# Patient Record
Sex: Male | Born: 1970 | Race: White | Hispanic: No | Marital: Married | Smoking: Never smoker
Health system: Southern US, Community
[De-identification: ages and names within clinical notes are randomized; demographics above are authoritative.]

## PROBLEM LIST (undated history)

## (undated) DIAGNOSIS — F329 Major depressive disorder, single episode, unspecified: Secondary | ICD-10-CM

## (undated) DIAGNOSIS — S2239XA Fracture of one rib, unspecified side, initial encounter for closed fracture: Secondary | ICD-10-CM

## (undated) DIAGNOSIS — E785 Hyperlipidemia, unspecified: Secondary | ICD-10-CM

## (undated) DIAGNOSIS — F32A Depression, unspecified: Secondary | ICD-10-CM

## (undated) DIAGNOSIS — IMO0002 Reserved for concepts with insufficient information to code with codable children: Secondary | ICD-10-CM

## (undated) DIAGNOSIS — H6980 Other specified disorders of Eustachian tube, unspecified ear: Secondary | ICD-10-CM

## (undated) DIAGNOSIS — D179 Benign lipomatous neoplasm, unspecified: Secondary | ICD-10-CM

## (undated) DIAGNOSIS — H6990 Unspecified Eustachian tube disorder, unspecified ear: Secondary | ICD-10-CM

## (undated) DIAGNOSIS — K594 Anal spasm: Secondary | ICD-10-CM

## (undated) DIAGNOSIS — F988 Other specified behavioral and emotional disorders with onset usually occurring in childhood and adolescence: Secondary | ICD-10-CM

## (undated) DIAGNOSIS — R030 Elevated blood-pressure reading, without diagnosis of hypertension: Secondary | ICD-10-CM

## (undated) DIAGNOSIS — J342 Deviated nasal septum: Secondary | ICD-10-CM

## (undated) DIAGNOSIS — D1803 Hemangioma of intra-abdominal structures: Secondary | ICD-10-CM

## (undated) HISTORY — DX: Depression, unspecified: F32.A

## (undated) HISTORY — DX: Hemangioma of intra-abdominal structures: D18.03

## (undated) HISTORY — DX: Other specified behavioral and emotional disorders with onset usually occurring in childhood and adolescence: F98.8

## (undated) HISTORY — DX: Fracture of one rib, unspecified side, initial encounter for closed fracture: S22.39XA

## (undated) HISTORY — DX: Unspecified eustachian tube disorder, unspecified ear: H69.90

## (undated) HISTORY — DX: Benign lipomatous neoplasm, unspecified: D17.9

## (undated) HISTORY — DX: Hyperlipidemia, unspecified: E78.5

## (undated) HISTORY — DX: Major depressive disorder, single episode, unspecified: F32.9

## (undated) HISTORY — DX: Deviated nasal septum: J34.2

## (undated) HISTORY — DX: Reserved for concepts with insufficient information to code with codable children: IMO0002

## (undated) HISTORY — DX: Anal spasm: K59.4

## (undated) HISTORY — PX: COLONOSCOPY: SHX174

## (undated) HISTORY — DX: Other specified disorders of Eustachian tube, unspecified ear: H69.80

## (undated) HISTORY — DX: Elevated blood-pressure reading, without diagnosis of hypertension: R03.0

---

## 2001-06-05 ENCOUNTER — Encounter: Admission: RE | Admit: 2001-06-05 | Discharge: 2001-06-05 | Payer: Self-pay | Admitting: Infectious Diseases

## 2001-06-17 ENCOUNTER — Encounter: Admission: RE | Admit: 2001-06-17 | Discharge: 2001-06-17 | Payer: Self-pay | Admitting: Infectious Diseases

## 2001-06-20 ENCOUNTER — Encounter: Admission: RE | Admit: 2001-06-20 | Discharge: 2001-06-20 | Payer: Self-pay | Admitting: Infectious Diseases

## 2001-06-25 ENCOUNTER — Encounter: Admission: RE | Admit: 2001-06-25 | Discharge: 2001-06-25 | Payer: Self-pay | Admitting: Infectious Diseases

## 2001-06-25 ENCOUNTER — Encounter: Payer: Self-pay | Admitting: Infectious Diseases

## 2001-06-25 ENCOUNTER — Ambulatory Visit (HOSPITAL_COMMUNITY): Admission: RE | Admit: 2001-06-25 | Discharge: 2001-06-25 | Payer: Self-pay | Admitting: Infectious Diseases

## 2001-11-20 ENCOUNTER — Emergency Department (HOSPITAL_COMMUNITY): Admission: EM | Admit: 2001-11-20 | Discharge: 2001-11-20 | Payer: Self-pay | Admitting: Emergency Medicine

## 2001-11-20 ENCOUNTER — Encounter: Payer: Self-pay | Admitting: Emergency Medicine

## 2003-07-24 ENCOUNTER — Encounter: Admission: RE | Admit: 2003-07-24 | Discharge: 2003-07-24 | Payer: Self-pay | Admitting: Family Medicine

## 2003-08-01 ENCOUNTER — Encounter: Admission: RE | Admit: 2003-08-01 | Discharge: 2003-08-01 | Payer: Self-pay | Admitting: Family Medicine

## 2004-02-09 ENCOUNTER — Ambulatory Visit: Payer: Self-pay | Admitting: Family Medicine

## 2004-04-05 ENCOUNTER — Ambulatory Visit: Payer: Self-pay | Admitting: Internal Medicine

## 2004-04-27 ENCOUNTER — Ambulatory Visit: Payer: Self-pay | Admitting: Internal Medicine

## 2004-05-05 ENCOUNTER — Encounter: Payer: Self-pay | Admitting: Internal Medicine

## 2004-05-25 ENCOUNTER — Ambulatory Visit: Payer: Self-pay | Admitting: Internal Medicine

## 2004-05-25 LAB — HM COLONOSCOPY

## 2004-09-30 ENCOUNTER — Ambulatory Visit: Payer: Self-pay | Admitting: Internal Medicine

## 2004-10-26 ENCOUNTER — Ambulatory Visit: Payer: Self-pay | Admitting: Internal Medicine

## 2005-02-20 ENCOUNTER — Ambulatory Visit: Payer: Self-pay | Admitting: Internal Medicine

## 2005-05-31 ENCOUNTER — Ambulatory Visit: Payer: Self-pay | Admitting: Internal Medicine

## 2005-10-05 ENCOUNTER — Ambulatory Visit: Payer: Self-pay | Admitting: Internal Medicine

## 2005-10-12 ENCOUNTER — Ambulatory Visit: Payer: Self-pay | Admitting: Internal Medicine

## 2006-04-02 ENCOUNTER — Ambulatory Visit (HOSPITAL_BASED_OUTPATIENT_CLINIC_OR_DEPARTMENT_OTHER): Admission: RE | Admit: 2006-04-02 | Discharge: 2006-04-02 | Payer: Self-pay | Admitting: Otolaryngology

## 2006-04-08 ENCOUNTER — Ambulatory Visit: Payer: Self-pay | Admitting: Internal Medicine

## 2006-08-30 ENCOUNTER — Ambulatory Visit: Payer: Self-pay | Admitting: Internal Medicine

## 2006-08-30 DIAGNOSIS — F988 Other specified behavioral and emotional disorders with onset usually occurring in childhood and adolescence: Secondary | ICD-10-CM | POA: Insufficient documentation

## 2006-08-30 DIAGNOSIS — Z8719 Personal history of other diseases of the digestive system: Secondary | ICD-10-CM

## 2006-08-30 DIAGNOSIS — R030 Elevated blood-pressure reading, without diagnosis of hypertension: Secondary | ICD-10-CM

## 2006-08-30 DIAGNOSIS — F411 Generalized anxiety disorder: Secondary | ICD-10-CM | POA: Insufficient documentation

## 2006-09-04 LAB — CONVERTED CEMR LAB
ALT: 24 U/L
AST: 24 U/L
BUN: 13 mg/dL
Basophils Absolute: 0 K/uL
Basophils Relative: 0.2 %
CO2: 33 meq/L — ABNORMAL HIGH
Calcium: 10.4 mg/dL
Chloride: 100 meq/L
Creatinine, Ser: 0.9 mg/dL
Eosinophils Absolute: 0.1 K/uL
Eosinophils Relative: 1.6 %
GFR calc Af Amer: 121 mL/min
GFR calc non Af Amer: 100 mL/min
Glucose, Bld: 85 mg/dL
HCT: 41.3 %
Hemoglobin: 14.5 g/dL
Lymphocytes Relative: 26.1 %
MCHC: 35.2 g/dL
MCV: 81.2 fL
Monocytes Absolute: 0.6 K/uL
Monocytes Relative: 7.2 %
Neutro Abs: 5 K/uL
Neutrophils Relative %: 64.9 %
Platelets: 304 K/uL
Potassium: 4.3 meq/L
RBC: 5.09 M/uL
RDW: 12.1 %
Sodium: 139 meq/L
TSH: 2.04 u[IU]/mL
WBC: 7.7 10*3/microliter

## 2006-11-15 ENCOUNTER — Ambulatory Visit: Payer: Self-pay | Admitting: Internal Medicine

## 2006-11-16 ENCOUNTER — Ambulatory Visit: Payer: Self-pay | Admitting: Internal Medicine

## 2006-11-16 LAB — CONVERTED CEMR LAB
Bilirubin Urine: NEGATIVE
Nitrite: NEGATIVE
Specific Gravity, Urine: 1.01
pH: 6

## 2006-11-20 ENCOUNTER — Ambulatory Visit: Payer: Self-pay | Admitting: Internal Medicine

## 2007-08-09 ENCOUNTER — Ambulatory Visit: Payer: Self-pay | Admitting: Internal Medicine

## 2007-08-12 ENCOUNTER — Encounter (INDEPENDENT_AMBULATORY_CARE_PROVIDER_SITE_OTHER): Payer: Self-pay | Admitting: *Deleted

## 2007-09-11 ENCOUNTER — Ambulatory Visit: Payer: Self-pay | Admitting: Internal Medicine

## 2007-09-11 ENCOUNTER — Encounter: Payer: Self-pay | Admitting: Internal Medicine

## 2007-09-11 DIAGNOSIS — K594 Anal spasm: Secondary | ICD-10-CM

## 2007-09-12 ENCOUNTER — Ambulatory Visit: Payer: Self-pay | Admitting: Internal Medicine

## 2007-11-13 ENCOUNTER — Ambulatory Visit: Payer: Self-pay | Admitting: Psychology

## 2007-11-13 ENCOUNTER — Encounter: Admission: RE | Admit: 2007-11-13 | Discharge: 2007-11-13 | Payer: Self-pay | Admitting: Neurology

## 2007-12-18 ENCOUNTER — Ambulatory Visit: Payer: Self-pay | Admitting: *Deleted

## 2007-12-18 DIAGNOSIS — M5126 Other intervertebral disc displacement, lumbar region: Secondary | ICD-10-CM

## 2007-12-18 DIAGNOSIS — E785 Hyperlipidemia, unspecified: Secondary | ICD-10-CM

## 2008-01-15 ENCOUNTER — Ambulatory Visit: Payer: Self-pay | Admitting: *Deleted

## 2008-01-15 DIAGNOSIS — H9190 Unspecified hearing loss, unspecified ear: Secondary | ICD-10-CM | POA: Insufficient documentation

## 2008-01-15 LAB — CONVERTED CEMR LAB
Glucose, Urine, Semiquant: NEGATIVE
Protein, U semiquant: NEGATIVE
Specific Gravity, Urine: 1.015
WBC Urine, dipstick: NEGATIVE

## 2008-01-16 ENCOUNTER — Encounter (INDEPENDENT_AMBULATORY_CARE_PROVIDER_SITE_OTHER): Payer: Self-pay | Admitting: *Deleted

## 2008-02-05 ENCOUNTER — Ambulatory Visit: Payer: Self-pay | Admitting: Family Medicine

## 2008-04-09 ENCOUNTER — Telehealth: Payer: Self-pay | Admitting: Internal Medicine

## 2008-12-03 ENCOUNTER — Telehealth: Payer: Self-pay | Admitting: Family

## 2008-12-03 ENCOUNTER — Ambulatory Visit: Payer: Self-pay | Admitting: Family

## 2008-12-03 DIAGNOSIS — R209 Unspecified disturbances of skin sensation: Secondary | ICD-10-CM | POA: Insufficient documentation

## 2008-12-03 DIAGNOSIS — E538 Deficiency of other specified B group vitamins: Secondary | ICD-10-CM

## 2008-12-03 LAB — CONVERTED CEMR LAB
BUN: 10 mg/dL (ref 6–23)
Basophils Absolute: 0.1 10*3/uL (ref 0.0–0.1)
Basophils Relative: 0.9 % (ref 0.0–3.0)
Calcium: 9.1 mg/dL (ref 8.4–10.5)
Cholesterol: 174 mg/dL (ref 0–200)
Eosinophils Absolute: 0.2 10*3/uL (ref 0.0–0.7)
Eosinophils Relative: 2.8 % (ref 0.0–5.0)
Folate: 6.8 ng/mL
HCT: 41.1 % (ref 39.0–52.0)
HDL: 38.2 mg/dL — ABNORMAL LOW (ref >39.00)
LDL Cholesterol: 114 mg/dL — ABNORMAL HIGH (ref 0–99)
Lymphs Abs: 1.8 10*3/uL (ref 0.7–4.0)
Monocytes Relative: 6.9 % (ref 3.0–12.0)
Neutrophils Relative %: 59 % (ref 43.0–77.0)
Potassium: 3.9 meq/L (ref 3.5–5.1)
Triglycerides: 108 mg/dL (ref 0.0–149.0)
Vitamin B-12: 222 pg/mL (ref 211–911)
WBC: 5.9 10*3/uL (ref 4.5–10.5)

## 2008-12-04 ENCOUNTER — Ambulatory Visit: Payer: Self-pay | Admitting: Family Medicine

## 2008-12-11 ENCOUNTER — Ambulatory Visit: Payer: Self-pay | Admitting: Internal Medicine

## 2008-12-18 ENCOUNTER — Ambulatory Visit: Payer: Self-pay | Admitting: Internal Medicine

## 2008-12-24 ENCOUNTER — Ambulatory Visit: Payer: Self-pay | Admitting: Internal Medicine

## 2009-01-27 ENCOUNTER — Ambulatory Visit: Payer: Self-pay | Admitting: Internal Medicine

## 2009-02-08 ENCOUNTER — Ambulatory Visit: Payer: Self-pay | Admitting: Internal Medicine

## 2009-02-08 DIAGNOSIS — F329 Major depressive disorder, single episode, unspecified: Secondary | ICD-10-CM

## 2009-02-11 LAB — CONVERTED CEMR LAB
ALT: 21 units/L (ref 0–53)
AST: 22 units/L (ref 0–37)
Albumin: 4.1 g/dL (ref 3.5–5.2)
Alkaline Phosphatase: 98 units/L (ref 39–117)
Bilirubin, Direct: 0.2 mg/dL (ref 0.0–0.3)
TSH: 1.37 microintl units/mL (ref 0.35–5.50)
Total Bilirubin: 1.5 mg/dL — ABNORMAL HIGH (ref 0.3–1.2)
Total Protein: 7.2 g/dL (ref 6.0–8.3)
Vitamin B-12: 388 pg/mL (ref 211–911)

## 2009-06-02 DIAGNOSIS — S2239XA Fracture of one rib, unspecified side, initial encounter for closed fracture: Secondary | ICD-10-CM

## 2009-06-02 DIAGNOSIS — D1803 Hemangioma of intra-abdominal structures: Secondary | ICD-10-CM

## 2009-06-02 HISTORY — DX: Fracture of one rib, unspecified side, initial encounter for closed fracture: S22.39XA

## 2009-06-02 HISTORY — DX: Hemangioma of intra-abdominal structures: D18.03

## 2009-06-18 ENCOUNTER — Encounter (INDEPENDENT_AMBULATORY_CARE_PROVIDER_SITE_OTHER): Payer: Self-pay | Admitting: *Deleted

## 2009-06-30 ENCOUNTER — Ambulatory Visit (HOSPITAL_BASED_OUTPATIENT_CLINIC_OR_DEPARTMENT_OTHER)
Admission: RE | Admit: 2009-06-30 | Discharge: 2009-06-30 | Payer: Self-pay | Source: Home / Self Care | Admitting: Internal Medicine

## 2009-06-30 ENCOUNTER — Ambulatory Visit: Payer: Self-pay | Admitting: Internal Medicine

## 2009-06-30 ENCOUNTER — Ambulatory Visit: Payer: Self-pay | Admitting: Interventional Radiology

## 2009-06-30 DIAGNOSIS — R1011 Right upper quadrant pain: Secondary | ICD-10-CM | POA: Insufficient documentation

## 2009-06-30 DIAGNOSIS — R42 Dizziness and giddiness: Secondary | ICD-10-CM

## 2009-06-30 DIAGNOSIS — R079 Chest pain, unspecified: Secondary | ICD-10-CM

## 2009-07-01 ENCOUNTER — Telehealth: Payer: Self-pay | Admitting: Internal Medicine

## 2009-07-07 ENCOUNTER — Telehealth (INDEPENDENT_AMBULATORY_CARE_PROVIDER_SITE_OTHER): Payer: Self-pay | Admitting: *Deleted

## 2009-07-08 ENCOUNTER — Ambulatory Visit: Payer: Self-pay | Admitting: Internal Medicine

## 2009-07-08 DIAGNOSIS — D1803 Hemangioma of intra-abdominal structures: Secondary | ICD-10-CM

## 2009-07-08 DIAGNOSIS — D179 Benign lipomatous neoplasm, unspecified: Secondary | ICD-10-CM | POA: Insufficient documentation

## 2009-08-24 ENCOUNTER — Encounter (INDEPENDENT_AMBULATORY_CARE_PROVIDER_SITE_OTHER): Payer: Self-pay | Admitting: *Deleted

## 2009-09-03 ENCOUNTER — Ambulatory Visit: Payer: Self-pay | Admitting: Internal Medicine

## 2009-09-27 ENCOUNTER — Ambulatory Visit: Payer: Self-pay | Admitting: Internal Medicine

## 2009-09-27 DIAGNOSIS — R220 Localized swelling, mass and lump, head: Secondary | ICD-10-CM

## 2009-09-27 DIAGNOSIS — R221 Localized swelling, mass and lump, neck: Secondary | ICD-10-CM

## 2009-09-28 ENCOUNTER — Ambulatory Visit: Payer: Self-pay | Admitting: Cardiovascular Disease

## 2009-09-28 ENCOUNTER — Telehealth: Payer: Self-pay | Admitting: Internal Medicine

## 2009-09-28 DIAGNOSIS — I776 Arteritis, unspecified: Secondary | ICD-10-CM

## 2009-09-29 ENCOUNTER — Encounter: Payer: Self-pay | Admitting: Internal Medicine

## 2009-09-29 LAB — CONVERTED CEMR LAB
ALT: 15 units/L (ref 0–53)
AST: 20 units/L (ref 0–37)
Albumin: 4.5 g/dL (ref 3.5–5.2)
Alkaline Phosphatase: 120 units/L — ABNORMAL HIGH (ref 39–117)
Eosinophils Absolute: 0.2 10*3/uL (ref 0.0–0.7)
Eosinophils Relative: 3 % (ref 0–5)
Lymphs Abs: 2.1 10*3/uL (ref 0.7–4.0)
Monocytes Absolute: 0.5 10*3/uL (ref 0.1–1.0)
Monocytes Relative: 7 % (ref 3–12)
Neutrophils Relative %: 62 % (ref 43–77)
RBC: 4.9 M/uL (ref 4.22–5.81)
Total Protein: 7.1 g/dL (ref 6.0–8.3)
WBC: 7.4 10*3/uL (ref 4.0–10.5)

## 2009-10-01 ENCOUNTER — Ambulatory Visit: Payer: Self-pay | Admitting: Vascular Surgery

## 2009-10-01 ENCOUNTER — Encounter: Payer: Self-pay | Admitting: Internal Medicine

## 2009-10-05 ENCOUNTER — Ambulatory Visit: Payer: Self-pay | Admitting: Internal Medicine

## 2009-10-05 DIAGNOSIS — J310 Chronic rhinitis: Secondary | ICD-10-CM

## 2009-10-08 ENCOUNTER — Ambulatory Visit: Payer: Self-pay | Admitting: Vascular Surgery

## 2009-11-02 DIAGNOSIS — D179 Benign lipomatous neoplasm, unspecified: Secondary | ICD-10-CM

## 2009-11-02 HISTORY — DX: Benign lipomatous neoplasm, unspecified: D17.9

## 2009-11-04 ENCOUNTER — Ambulatory Visit (HOSPITAL_BASED_OUTPATIENT_CLINIC_OR_DEPARTMENT_OTHER): Admission: RE | Admit: 2009-11-04 | Discharge: 2009-11-04 | Payer: Self-pay | Admitting: General Surgery

## 2009-11-04 ENCOUNTER — Encounter: Payer: Self-pay | Admitting: Internal Medicine

## 2009-12-07 ENCOUNTER — Encounter: Admission: RE | Admit: 2009-12-07 | Discharge: 2009-12-07 | Payer: Self-pay | Admitting: Internal Medicine

## 2009-12-07 ENCOUNTER — Telehealth: Payer: Self-pay | Admitting: Internal Medicine

## 2009-12-11 ENCOUNTER — Encounter
Admission: RE | Admit: 2009-12-11 | Discharge: 2009-12-11 | Payer: Self-pay | Source: Home / Self Care | Attending: Internal Medicine | Admitting: Internal Medicine

## 2009-12-13 ENCOUNTER — Telehealth: Payer: Self-pay | Admitting: Internal Medicine

## 2009-12-13 ENCOUNTER — Encounter: Payer: Self-pay | Admitting: Internal Medicine

## 2010-01-23 ENCOUNTER — Encounter: Payer: Self-pay | Admitting: Family Medicine

## 2010-01-31 ENCOUNTER — Ambulatory Visit (INDEPENDENT_AMBULATORY_CARE_PROVIDER_SITE_OTHER)
Admission: RE | Admit: 2010-01-31 | Discharge: 2010-01-31 | Payer: BC Managed Care – PPO | Source: Home / Self Care | Attending: Internal Medicine | Admitting: Internal Medicine

## 2010-01-31 DIAGNOSIS — G5600 Carpal tunnel syndrome, unspecified upper limb: Secondary | ICD-10-CM | POA: Insufficient documentation

## 2010-01-31 DIAGNOSIS — K7689 Other specified diseases of liver: Secondary | ICD-10-CM
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
## 2010-02-01 NOTE — Assessment & Plan Note (Signed)
Summary: b12 shot/kdc  Nurse Visit   Allergies: No Known Drug Allergies  Medication Administration  Injection # 1:    Medication: Vit B12 1000 mcg    Diagnosis: VITAMIN B12 DEFICIENCY (ICD-266.2)    Route: IM    Site: R deltoid    Exp Date: 10/2010    Lot #: [id number]    Mfr: [employer] Regent    Patient tolerated injection without complications    Given by: Floydene Flock (January 27, 2009 2:36 PM)  Orders Added: 1)  Admin of Therapeutic Inj  intramuscular or subcutaneous [96372] 2)  Vit B12 1000 mcg [J3420]   Medication Administration  Injection # 1:    Medication: Vit B12 1000 mcg    Diagnosis: VITAMIN B12 DEFICIENCY (ICD-266.2)    Route: IM    Site: R deltoid    Exp Date: 10/2010    Lot #: [id number]    Mfr: American Regent    Patient tolerated injection without complications    Given by: Floydene Flock (January 27, 2009 2:36 PM)  Orders Added: 1)  Admin of Therapeutic Inj  intramuscular or subcutaneous [96372] 2)  Vit B12 1000 mcg [J3420]

## 2010-02-01 NOTE — Letter (Signed)
Summary: Colonoscopy Letter  [facility]  [address] [place], [place] [address]   Phone: [phone]  Fax: [phone]      June 18, 2009 MRN: [id number]   Victor Patterson [address] [place], [place]  [address]   Dear Mr. Gadomski,   According to your medical record, it is time for you to schedule a Colonoscopy. The American Cancer Society recommends this procedure as a method to detect early colon cancer. Patients with a family history of colon cancer, or a personal history of colon polyps or inflammatory bowel disease are at increased risk.  This letter has beeen generated based on the recommendations made at the time of your procedure. If you feel that in your particular situation this may no longer apply, please contact our office.  Please call our office at [phone] to schedule this appointment or to update your records at your earliest convenience.  Thank you for cooperating with [place] to provide you with the very best care possible.   Sincerely,    Iva Boop, M.D.  [facility] Gastroenterology Division [phone]
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
## 2010-02-01 NOTE — Assessment & Plan Note (Signed)
Summary: CPX AND FASTING LABS///SPH  Flu Vaccine Consent Questions     Do you have a history of severe allergic reactions to this vaccine? no    Any prior history of allergic reactions to egg and/or gelatin? no    Do you have a sensitivity to the preservative Thimersol? no    Do you have a past history of Guillan-Barre Syndrome? no    Do you currently have an acute febrile illness? no    Have you ever had a severe reaction to latex? no    Vaccine information given and explained to patient? yes    Are you currently pregnant? no    Lot Number:[id number]   Exp Date:07/01/2009   Site GivenrightDeltoid IM Shary Decamp  February 08, 2009 10:02 AM  Vital Signs:  Patient profile:   40 year old male Height:      75 inches Weight:      207 pounds BMI:     25.97 Pulse rate:   76 / minute BP sitting:   110 / 70  Vitals Entered By: Shary Decamp (February 08, 2009 10:00 AM) CC: cpx, not fasting, c/o of bitlat hand numbness, tingling Is Patient Diabetic? No   History of Present Illness: cpx, not fasting c/o of B upper extremity tingling, mostly after he sleeps with his elbows bended Symptoms  started about a month ago,   incidentally he is doing some renovation at his home thus  using his arms a lot. No neck pain  Preventive Screening-Counseling & Management  Alcohol-Tobacco     Alcohol drinks/day: occasionally     Smoking Status: never  Current Medications (verified): 1)  L-Lysine .... Prn 2)  Mvi .Marland Kitchen.. 1 By Mouth Daily 3)  Fish Oil .Marland Kitchen.. 1 By Mouth Daily 4)  Motrin Ib 200 Mg Tabs (Ibuprofen) .... Two Tabs Every 6 Hours As Needed. 5)  Vyvanse 70 Mg Caps (Lisdexamfetamine Dimesylate) .... Once Daily (Rx'd By Evelene Croon) 6)  Pristiq 50 Mg Xr24h-Tab (Desvenlafaxine Succinate) .... Once Daily (Rx'd By Evelene Croon)  Allergies (verified): No Known Drug Allergies  Past History:  Past Medical History: ADD --- sees psych depression --- sees psych  Hyperlipidemia - not on meds  DEVIATED NASAL  SEPTUM   EUSTACHIAN TUBE DYSFUNCTION, RIGHT  PROCTALGIA FUGAX   herniated disc - lower back - has seen ortho in the past and had steroid shots 2005 ELEVATED BLOOD PRESSURE WITHOUT DIAGNOSIS OF HYPERTENSION (ICD-796.2) - resolved    Past Surgical History: Reviewed history from 12/18/2007 and no changes required. Denies surgical history  Family History: Reviewed history from 09/11/2007 and no changes required. DM - M, uncle MI-- no, GF CAD late in life  Colon Cancer:grandmother in  her 65s Colon Polyps:father in 73's  Social History: "[name]" Married, no children Occupation: Advertising account planner, road side assistance for  trucks  Patient has never smoked.  Daily Caffeine Use 2/day Illicit Drug Use - no Alcohol Use - yes no exercise diet-- candy a lot otherwise diet is average   Review of Systems General:  Denies fatigue, fever, and weight loss. CV:  Denies chest pain or discomfort and swelling of feet. Resp:  Denies cough and shortness of breath. GI:  Denies bloody stools, nausea, and vomiting. GU:  Denies hematuria and urinary hesitancy; once in a while  blood in stools w/ large BMs.  Physical Exam  General:  alert, well-developed, and well-nourished.   Neck:  no masses and no thyromegaly.   Lungs:  normal respiratory effort, no  intercostal retractions, no accessory muscle use, and normal breath sounds.   Heart:  normal rate, regular rhythm, no murmur, and no gallop.   Abdomen:  soft, non-tender, no distention, and no masses.   Msk:  inspection and palpation of the upper extremities normal Extremities:   no lower extremity edema  Neurologic:  upper extremities motor, tone, reflexes are normal Psych:  Oriented X3, memory intact for recent and remote, normally interactive, good eye contact, not anxious appearing, and not depressed appearing.     Impression & Recommendations:  Problem # 1:  WELL ADULT (ICD-V70.0) Td 2009 has a family history of colon polyps and colon cancer, last  colonoscopy was in 2006, next in 10 years all recent labs okay printed material provided regards diet and exercise  Orders: TLB-TSH (Thyroid Stimulating Hormone) (84443-TSH) TLB-Hepatic/Liver Function Pnl (80076-HEPATIC)  Problem # 2:  PARESTHESIA (ICD-782.0) his  B12 level was in the lower side of normal, he was started on shots thinking that the paresthesias could be related to low B12 not sure if paresthesias  are any  better  nevertheless, paresthesias are more consistent with cubital tunnel syndrome, probably due to overuse of his elbows, see HPI plan: switch B12 to by mouth meds  labs  .   Orders: Venipuncture (91478) TLB-B12, Serum-Total ONLY (29562-Z30)  Complete Medication List: 1)  L-lysine  .... Prn 2)  Fish Oil  .Marland Kitchen.. 1 by mouth daily 3)  Motrin Ib 200 Mg Tabs (Ibuprofen) .... Two tabs every 6 hours as needed. 4)  Vyvanse 70 Mg Caps (Lisdexamfetamine dimesylate) .... Once daily (rx'd by kaur) 5)  Pristiq 50 Mg Xr24h-tab (Desvenlafaxine succinate) .... Once daily (rx'd by kaur)  Other Orders: Admin 1st Vaccine (86578) Flu Vaccine 46yrs + (46962)  Patient Instructions: 1)  Please schedule a follow-up appointment in 6 months .    Preventive Care Screening  Prior Values:    Colonoscopy:  Results: Hemorrhoids.     Internal/External (05/25/2004)    Last Tetanus Booster:  Tdap (12/18/2007)

## 2010-02-01 NOTE — Progress Notes (Signed)
Summary: Radiology Testing  Phone Note Call from Patient Call back at Home Phone [phone]   Caller: Patient Summary of Call: Patient called and left voice message requesting the results of his radiology tests Initial call taken by: Glendell Docker CMA,  July 01, 2009 3:16 PM  Follow-up for Phone Call        discussed x ray study results.  Follow-up by: D. Thomos Lemons DO,  July 01, 2009 3:31 PM

## 2010-02-01 NOTE — Progress Notes (Signed)
Summary: Test Results  Phone Note Outgoing Call   Summary of Call: call pt - liver u/s could not visualize liver lesion seen on prev CT of abd and pelvis radiologist recommends either CT of liver or MRI of Liver  I suggest MRI of liver - see orders Initial call taken by: D. Thomos Lemons DO,  December 07, 2009 5:38 PM  Follow-up for Phone Call        call placed to patient at [phone], no answer. A detailed voice message was left informing patient per Dr Artist Pais instructions. Message left informing patient he would be contacted by referral coordinator with the appointment date and time for MRI, he was advised to call back if any questions Follow-up by: Glendell Docker CMA,  December 08, 2009 8:22 AM

## 2010-02-01 NOTE — Letter (Signed)
Summary: Previsit letter  [facility]  [address] [place], [place] [address]   Phone: [phone]  Fax: [phone]       08/24/2009 MRN: [id number]  Victor Patterson [address] [name], [place]  [address]  Dear [name],  Welcome to the Gastroenterology Division at [facility].    You are scheduled to see a nurse for your pre-procedure visit on September 30, 2009 at 10:30am on the 3rd floor at [facility], [address]  We ask that you try to arrive at our office 15 minutes prior to your appointment time to allow for check-in.  Your nurse visit will consist of discussing your medical and surgical history, your immediate family medical history, and your medications.    Please bring a complete list of all your medications or, if you prefer, bring the medication bottles and we will list them.  We will need to be aware of both prescribed and over the counter drugs.  We will need to know exact dosage information as well.  If you are on blood thinners (Coumadin, Plavix, Aggrenox, Ticlid, etc.) please call our office today/prior to your appointment, as we need to consult with your physician about holding your medication.   Please be prepared to read and sign documents such as consent forms, a financial agreement, and acknowledgement forms.  If necessary, and with your consent, a friend or relative is welcome to sit-in on the nurse visit with you.  Please bring your insurance card so that we may make a copy of it.  If your insurance requires a referral to see a specialist, please bring your referral form from your primary care physician.  No co-pay is required for this nurse visit.     If you cannot keep your appointment, please call [phone] to cancel or reschedule prior to your appointment date.  This allows [place] the opportunity to schedule an appointment for another patient in need of care.    Thank you for choosing  Gastroenterology for  your medical needs.  We appreciate the opportunity to care for you.  Please visit [place] at our website  to learn more about our practice.                     Sincerely.                                                                                                                   The Gastroenterology Division

## 2010-02-01 NOTE — Progress Notes (Signed)
Summary: Dr Karin Golden is on the phone for you   Phone Note Call from Patient   Summary of Call: Dr Karin Golden  is on the phone for you  Initial call taken by: Roselle Locus,  September 28, 2009 2:06 PM  Follow-up for Phone Call        arterial dz -  arteritis  no luminal narrowing wall thickening  arteritis picture  Informed pt of CT results.  Pt will come in for vasculitis labs. also refer to vascular specialist to review CT scan and determine treatment options Follow-up by: D. Thomos Lemons DO,  September 28, 2009 2:11 PM  Additional Follow-up for Phone Call Additional follow up Details #1::        call pt - vasculitis labs negative Additional Follow-up by: D. Thomos Lemons DO,  September 30, 2009 5:41 PM  New Problems: ARTERITIS (ICD-447.6)   Additional Follow-up for Phone Call Additional follow up Details #2::    call placed to patient he has been advised per Dr Artist Pais instructions Follow-up by: Glendell Docker CMA,  October 01, 2009 9:51 AM  New Problems: ARTERITIS (ICD-447.6)

## 2010-02-01 NOTE — Letter (Signed)
Summary: [facility]  [facility]    Imported By: Maryln Gottron 10/18/2009 14:37:06  _____________________________________________________________________  External Attachment:    Type:   Image     Comment:   External Document

## 2010-02-01 NOTE — Assessment & Plan Note (Signed)
Summary: chest congestion/mhf--Rm     Vital Signs:  Patient profile:   40 year old male Height:      75 inches Weight:      209.25 pounds BMI:     26.25 O2 Sat:      99 % on Room air Temp:     98.5 degrees F oral Pulse rate:   84 / minute Pulse rhythm:   regular Resp:     16 per minute BP sitting:   128 / 80  (left arm) Cuff size:   regular  Vitals Entered By: Mervin Kung CMA Duncan Dull) (October 05, 2009 11:18 AM)  O2 Flow:  Room air Is Patient Diabetic? No Pain Assessment Patient in pain? no      Comments Pt states Adderall is now 1 tablet daily. Rossie Muskrat is 1 two times a day. Nicki Guadalajara Fergerson CMA Duncan Dull)  October 05, 2009 11:25 AM    Primary Care Provider:  [name]   History of Present Illness: 40 y/o white male c/o chest congestion with dry and cough x 10 days. Tried Mucinex with mild improvement. no purulent nasal discharge  no fever or chills  pt seen by Dr. Imogene Burn carotid doppler planned sed rate, CRP, ANA and RF - negative  Allergies (verified): No Known Drug Allergies  Past History:  Past Medical History: ADD --- sees psych depression --- sees psych  Hyperlipidemia - not on meds    DEVIATED NASAL SEPTUM   EUSTACHIAN TUBE DYSFUNCTION, RIGHT    PROCTALGIA FUGAX   herniated disc - lower back - has seen ortho in the past and had steroid shots 2005 ELEVATED BLOOD PRESSURE WITHOUT DIAGNOSIS OF HYPERTENSION (ICD-796.2) - resolved Liver hemangioma - 06/2009 Rib fracture 8th and 9th- 06/2009    Family History: DM - M, uncle MI-- no, GF CAD late in life  Colon Cancer:grandmother in  her 19s Colon Polyps:father in 40's  no fam hx of liver dz   Social History: "[name]" Married, no children  (wife Olegario Messier, nephew Sheria Lang) Occupation: [employer], road side assistance for  trucks  Patient has never smoked.  Daily Caffeine Use 2/day   Illicit Drug Use - no Alcohol Use - yes (social) no exercise diet-- candy a lot otherwise diet is average   Physical  Exam  General:  alert, well-developed, and well-nourished.   Ears:  R ear normal and L ear normal.   Mouth:  postnasal drip.   Lungs:  normal respiratory effort, normal breath sounds, and no wheezes.   Heart:  normal rate, regular rhythm, and no gallop.     Impression & Recommendations:  Problem # 1:  RHINITIS (ICD-472.0) probable allergic rhinitis use otc allergra and nasal steroid Patient advised to call office if symptoms persist or worsen.  Problem # 2:  ARTERITIS (ICD-447.6) CT of neck showed:  Abnormal thickening of the distal common carotid artery wall with extension into the external carotid artery and possibly to a minor degree within the proximal internal carotid artery.  The appearance seems to suggest arteritis or periarteritis.  pt seen by Dr. Imogene Burn.   sed rate, CRP and ANA - normal  .  I doubt vasculitis carotid doppler planned  Complete Medication List: 1)  L-lysine  .... Prn 2)  Fish Oil 1200 Mg Caps (Omega-3 fatty acids) .... Take 1 capsule by mouth once a day 3)  Adderall Xr 30 Mg Xr24h-cap (Amphetamine-dextroamphetamine) .... Take 1 tablet by mouth two times a day 4)  Pristiq  50 Mg Xr24h-tab (Desvenlafaxine succinate) .... Once daily (rx'd by kaur) 5)  Kapvay 0.1 Mg Xr12h-tab (Clonidine hcl) .... Take 2  tablets by mouth two times a day 6)  Fexofenadine Hcl 180 Mg Tabs (Fexofenadine hcl) .... One by mouth once daily 7)  Fluticasone Propionate 50 Mcg/act Susp (Fluticasone propionate) .... 2 sprays each nostril once daily  Patient Instructions: 1)  Lloyd Huger med sinus rinse 2)  Patient advised to call office if symptoms persist or worsen. Prescriptions: FLUTICASONE PROPIONATE 50 MCG/ACT SUSP (FLUTICASONE PROPIONATE) 2 sprays each nostril once daily  #1 x 3   Entered and Authorized by:   D. Thomos Lemons DO   Signed by:   D. Thomos Lemons DO on 10/05/2009   Method used:   Electronically to        [id number] ([facility])       [address]        [place], [place]  [address]       Ph: [phone] or [phone]       Fax: [phone]   RxID:   [id number] FEXOFENADINE HCL 180 MG TABS (FEXOFENADINE HCL) one by mouth once daily  #30 x 3   Entered and Authorized by:   D. Thomos Lemons DO   Signed by:   D. Thomos Lemons DO on 10/05/2009   Method used:   Electronically to        [id number] ([facility]       [address]       [place], [place]  [address]       Ph: [phone] or [phone]       Fax: [phone]   RxID:   [id number]   Current Allergies (reviewed today): No known allergies

## 2010-02-03 NOTE — Progress Notes (Signed)
Summary: MRI Results  Phone Note Outgoing Call   Summary of Call: call pt - MRI of liver shows lesion is benigh.  Liver is noted to be fatty. I suggest pt work on weight loss.  avoid fatty foods.  avoid refined sugars (especially high fructose corn syrup),  avoid alcohol use Initial call taken by: D. Thomos Lemons DO,  December 13, 2009 12:05 PM  Follow-up for Phone Call        call placed to patient at [phone], no answer. A detailed voice message was left informing patient per Dr Artist Pais instructions. Message was left for patient to call back if any questions Follow-up by: Glendell Docker CMA,  December 13, 2009 1:50 PM

## 2010-02-03 NOTE — Letter (Signed)
Summary: [facility]   Imported By: Lanelle Bal 01/06/2010 13:50:02  _____________________________________________________________________  External Attachment:    Type:   Image     Comment:   External Document

## 2010-02-17 NOTE — Assessment & Plan Note (Signed)
Summary: numbness in hand/hea/rsch with pt/ss   Vital Signs:  Patient profile:   40 year old male Height:      75 inches Weight:      218.75 pounds BMI:     27.44 O2 Sat:      100 % on Room air Temp:     98.0 degrees F oral Pulse rate:   85 / minute Resp:     18 per minute BP sitting:   110 / 72  (right arm) Cuff size:   large  Vitals Entered By: Glendell Docker CMA (January 31, 2010 9:09 AM)  O2 Flow:  Room air CC: numbing in hands Is Patient Diabetic? No Pain Assessment Patient in pain? no      Comments c/o numbing in hands for the past 2 weeks only when he is sleeping, dicuss labs results for fatty liver   Primary Care Provider:  [name]  CC:  numbing in hands.  History of Present Illness:  hands are getting numb at night - consistently at night worse with using pillows use to be both now mainly right hand    12/2009 - IMPRESSION: Right hepatic lobe benign hemangiomata and simple hepatic cysts. Hepatic steatosis.  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  Daily Caffeine Use 2/day    Illicit Drug Use - no Alcohol Use - yes (social) no exercise diet-- candy a lot otherwise diet is average   Physical Exam  General:  alert, well-developed, and well-nourished.   Lungs:  normal respiratory effort, normal breath sounds, and no wheezes.   Heart:  normal rate, regular rhythm, and no gallop.   Msk:  positive phalens  Neurologic:  cranial nerves II-XII intact, sensation intact to light touch, gait normal, and DTRs symmetrical and normal.     Impression & Recommendations:  Problem # 1:  LIVER HEMANGIOMA (ICD-228.04) Assessment Improved abd MRI of liver negative for malignancy - 12/2009 IMPRESSION: Right hepatic lobe benign hemangiomata and simple hepatic cysts. Hepatic steatosis.  Problem # 2:  CARPAL TUNNEL SYNDROME, RIGHT (ICD-354.0) pt experiencing hand numbness at night I suspect CTS trial of conservative measures - wrist splint and stretching exercises  Problem # 3:  FATTY LIVER DISEASE (ICD-571.8)  Orders: T-Hepatic Function (81191-47829) T-Lipid Profile (56213-08657) CRP, high sensitivity-[facility] (84696-29528)  Complete Medication List: 1)  L-lysine  .... Prn 2)  Fish Oil 1200 Mg Caps (Omega-3 fatty acids) .... Take 1 capsule by mouth once a day 3)  Adderall Xr 30 Mg Xr24h-cap (Amphetamine-dextroamphetamine) .... Take 1 tablet  by mouth two times a day 4)  Pristiq 50 Mg Xr24h-tab (Desvenlafaxine succinate) .... Once daily (rx'd by kaur) 7  Patient Instructions: 1)  Wear right wrist splint every night x 4-6 weeks 2)  Call our office if your symptoms do not  improve or gets worse.   Orders Added: 1)  T-Hepatic Function [80076-22960] 2)  T-Lipid Profile [80061-22930] 3)  CRP, high sensitivity-[facility] [16109-60454] 4)  Est. Patient Level III [[address]]    Current Allergies (reviewed today): No known allergies

## 2010-03-15 LAB — POCT HEMOGLOBIN-HEMACUE: Hemoglobin: 14.9 g/dL (ref 13.0–17.0)

## 2010-03-25 ENCOUNTER — Other Ambulatory Visit: Payer: Self-pay | Admitting: Vascular Surgery

## 2010-03-25 DIAGNOSIS — I7771 Dissection of carotid artery: Secondary | ICD-10-CM

## 2010-03-29 ENCOUNTER — Encounter (INDEPENDENT_AMBULATORY_CARE_PROVIDER_SITE_OTHER): Payer: Self-pay | Admitting: *Deleted

## 2010-04-05 NOTE — Letter (Signed)
Summary: Pre Visit Letter Revised  [facility]  [address] [place], [place] [address]   Phone: [phone]  Fax: [phone]        03/29/2010 MRN: [id number] MERCURY ROCK [address] [name], [place]  [address]             Procedure Date:  05/26/2010 @ 9:00   recall colon-Dr. Leone Payor   Welcome to the Gastroenterology Division at [facility]    You are scheduled to see a nurse for your pre-procedure visit on 05/12/2010 at 3:30 on the 3rd floor at [facility], [address].  We ask that you try to arrive at our office 15 minutes prior to your appointment time to allow for check-in.  Please take a minute to review the attached form.  If you answer "Yes" to one or more of the questions on the first page, we ask that you call the person listed at your earliest opportunity.  If you answer "No" to all of the questions, please complete the rest of the form and bring it to your appointment.    Your nurse visit will consist of discussing your medical and surgical history, your immediate family medical history, and your medications.   If you are unable to list all of your medications on the form, please bring the medication bottles to your appointment and we will list them.  We will need to be aware of both prescribed and over the counter drugs.  We will need to know exact dosage information as well.    Please be prepared to read and sign documents such as consent forms, a financial agreement, and acknowledgement forms.  If necessary, and with your consent, a friend or relative is welcome to sit-in on the nurse visit with you.  Please bring your insurance card so that we may make a copy of it.  If your insurance requires a referral to see a specialist, please bring your referral form from your primary care physician.  No co-pay is required for this nurse visit.     If you cannot keep your appointment, please call [phone] to cancel or reschedule prior  to your appointment date.  This allows [place] the opportunity to schedule an appointment for another patient in need of care.    Thank you for choosing [facility] for your medical needs.  We appreciate the opportunity to care for you.  Please visit [place] at our website  to learn more about our practice.  Sincerely, The Gastroenterology Division

## 2010-04-22 ENCOUNTER — Other Ambulatory Visit: Payer: BC Managed Care – PPO

## 2010-04-22 ENCOUNTER — Ambulatory Visit: Payer: BC Managed Care – PPO | Admitting: Vascular Surgery

## 2010-05-03 ENCOUNTER — Encounter: Payer: Self-pay | Admitting: Internal Medicine

## 2010-05-03 ENCOUNTER — Ambulatory Visit (INDEPENDENT_AMBULATORY_CARE_PROVIDER_SITE_OTHER): Payer: BC Managed Care – PPO | Admitting: Internal Medicine

## 2010-05-03 VITALS — BP 120/90 | HR 82 | Temp 98.3°F | Resp 18 | Wt 220.0 lb

## 2010-05-03 DIAGNOSIS — L989 Disorder of the skin and subcutaneous tissue, unspecified: Secondary | ICD-10-CM

## 2010-05-03 DIAGNOSIS — M542 Cervicalgia: Secondary | ICD-10-CM

## 2010-05-03 NOTE — Patient Instructions (Signed)
Our office will contact you re:  Dermatology referral (Dr Terri Piedra)

## 2010-05-05 ENCOUNTER — Other Ambulatory Visit: Payer: Self-pay | Admitting: Surgery

## 2010-05-06 ENCOUNTER — Other Ambulatory Visit: Payer: BC Managed Care – PPO

## 2010-05-06 ENCOUNTER — Ambulatory Visit: Payer: BC Managed Care – PPO | Admitting: Vascular Surgery

## 2010-05-12 ENCOUNTER — Ambulatory Visit (AMBULATORY_SURGERY_CENTER): Payer: BC Managed Care – PPO | Admitting: *Deleted

## 2010-05-12 ENCOUNTER — Encounter: Payer: Self-pay | Admitting: Internal Medicine

## 2010-05-12 VITALS — Ht 76.0 in | Wt 221.3 lb

## 2010-05-12 DIAGNOSIS — Z800 Family history of malignant neoplasm of digestive organs: Secondary | ICD-10-CM

## 2010-05-12 MED ORDER — PEG-KCL-NACL-NASULF-NA ASC-C 100 G PO SOLR: ORAL | Status: DC

## 2010-05-13 ENCOUNTER — Ambulatory Visit (INDEPENDENT_AMBULATORY_CARE_PROVIDER_SITE_OTHER): Payer: BC Managed Care – PPO | Admitting: Family Medicine

## 2010-05-13 ENCOUNTER — Encounter: Payer: Self-pay | Admitting: Family Medicine

## 2010-05-13 VITALS — BP 136/87 | HR 86 | Temp 98.3°F | Ht 76.0 in | Wt 218.0 lb

## 2010-05-13 DIAGNOSIS — J069 Acute upper respiratory infection, unspecified: Secondary | ICD-10-CM

## 2010-05-13 MED ORDER — HYDROCODONE-HOMATROPINE 5-1.5 MG/5ML PO SYRP: ORAL_SOLUTION | ORAL | Status: DC

## 2010-05-13 NOTE — Progress Notes (Signed)
OFFICE NOTE  05/13/2010  CC:  Chief Complaint  Patient presents with  . URI    runny nose, sinus drainage x 2 day, denies fever, wakes up with sore throat     HPI:   Patient is a 40 y.o. Caucasian male who is here for URI.  Primary MD is Dr. Artist Pais. Two days history of nasal congestion alt w/runny nose, PND bothersome at night and causes ST in night and AM.  ST gone midway through the morning.  No cough, no fever, no upper teeth pain, no facial pain, no HA.   His wife had similar illness last week.  Patient has presentation early next week and wants to feel well for it. He has not tried any OTC cold meds.  Pertinent PMH:  No history of seasonal allergies or recurrent infectious sinusitis. No asthma history.   No hx of HTN  MEDS;   Outpatient Prescriptions Prior to Visit  Medication Sig Dispense Refill  . amphetamine-dextroamphetamine (ADDERALL XR) 30 MG 24 hr capsule Take 30 mg by mouth 2 (two) times daily.        Marland Kitchen LYSINE PO Take by mouth daily as needed.        . Omega-3 Fatty Acids (FISH OIL) 1200 MG CAPS Take 1,200 mg by mouth daily.        . peg 3350 powder (MOVIPREP) 100 G SOLR movi prep as directed  1 kit  0  . desvenlafaxine (PRISTIQ) 50 MG 24 hr tablet Take 50 mg by mouth daily.          PE: Blood pressure 136/87, pulse 86, temperature 98.3 F (36.8 C), temperature source Oral, height 6\' 4"  (1.93 m), weight 218 lb (98.884 kg). VS: noted--normal. Gen: alert, NAD, NONTOXIC APPEARING. HEENT: eyes without injection, drainage, or swelling.  Ears: EACs clear, TMs with normal light reflex and landmarks.  Nose: Clear rhinorrhea, with some dried, crusty exudate adherent to mildly injected mucosa.  No purulent d/c.  No paranasal sinus TTP.  No facial swelling.  Throat and mouth without focal lesion.  No pharyngial swelling, erythema, or exudate.   Neck: supple, no LAD.   LUNGS: CTA bilat, nonlabored resps.   CV: RRR, no m/r/g. EXT: no c/c/e SKIN: no rash    IMPRESSION AND  PLAN:  Viral URI Discussed symptomatic care extensively.  After going over the options, he decided on trying neti pot once daily and I gave a sample of veramyst for him to take 2 sprays each nostril qd x 10d or so. I recommended he consider adding daily OTC nonsedating antihistamine as needed and also OTC aftrin for 3 days max. I did give rx for hycodan cough syrup for him to hold and fill if he starts getting significant cough (esp if it impairs sleep).  Therapeutic expectations and side effect profile of medication discussed today.  Patient's questions answered. He'll call if no better in 7-10d, earlier if worse.     FOLLOW UP:  Return if symptoms worsen or fail to improve.

## 2010-05-13 NOTE — Assessment & Plan Note (Signed)
Discussed symptomatic care extensively.  After going over the options, he decided on trying neti pot once daily and I gave a sample of veramyst for him to take 2 sprays each nostril qd x 10d or so. I recommended he consider adding daily OTC nonsedating antihistamine as needed and also OTC aftrin for 3 days max. I did give rx for hycodan cough syrup for him to hold and fill if he starts getting significant cough (esp if it impairs sleep).  Therapeutic expectations and side effect profile of medication discussed today.  Patient's questions answered. He'll call if no better in 7-10d, earlier if worse.

## 2010-05-13 NOTE — Patient Instructions (Signed)
Use neti pot once daily. Consider adding an over the counter antihistamine such as allegra, zyrtec, or claritin. Consider using generic afrin (oxymetazolone) nasal spray q12h for a few days for nasal congestion. Take veramyst (sample) nasal spray: 2 sprays each nostril each day. Call or return if not feeling much better in 7-10 days.

## 2010-05-17 NOTE — Procedures (Signed)
CAROTID DUPLEX EXAM   INDICATION:  Suspected dissection due to mass on left side of neck.   HISTORY:  Diabetes:  No.  Cardiac:  No.  Hypertension:  No.  Smoking:  No.  Previous Surgery:  No.  CV History:  None.  Amaurosis Fugax , Paresthesias , Hemiparesis                                       RIGHT             LEFT  Brachial systolic pressure:  Brachial Doppler waveforms:  Vertebral direction of flow:                          Not visualized  DUPLEX VELOCITIES (cm/sec)  CCA peak systolic                                     114  ECA peak systolic                                     134  ICA peak systolic                                     82  ICA end diastolic                                     44  PLAQUE MORPHOLOGY:                                    Smooth  PLAQUE AMOUNT:                                        Mild  PLAQUE LOCATION:                                      Bifurcation   IMPRESSION:  1. Left internal carotid artery shows a small amount of smooth plaque      in the bifurcation.  2. Velocities suggest a 1% to 39% stenosis.  3. No evidence of dissection was found.   ___________________________________________  Leonides Sake, MD   EM/MEDQ  D:  10/08/2009  T:  10/08/2009  Job:  [id number]

## 2010-05-17 NOTE — Assessment & Plan Note (Signed)
OFFICE VISIT   Victor Patterson, Victor Patterson  DOB:  [birth year]                                       10/08/2009  EAVWU#:[id number]   HISTORY OF PRESENT ILLNESS:  This is a 40 year old gentleman who  presented with a chief complaint previously of left neck mass.  Based on  my previous evaluation, there was some concern of possible interval  dissection of his left carotid artery based on the story of a trauma and  also findings on the CT scan.  Since seeing me last week, the patient  has had resolution of this pain with swallowing and no longer any  sensation of mass in his left neck.  He underwent a carotid duplex  today.  At this point he has had no stroke or TIA symptomatology.   His past medical history, past surgical history, social history, review  of systems, family history is unchanged from his previous evaluation on  the September 20,2011.   Focused exam today: There is no carotid bruit in the left side and there  is no sense of fullness in the left neck.  I do not feel any pulsatile  masses in the left neck.   He had noninvasive vascular studies consisting of a left-sided carotid  duplex completed, demonstrated a peak systolic velocity of 82 with an  end diastolic of 44.  There is a small amount of smooth plaque in the  bifurcation and no obvious dissection was noted or aneurysmal growth in  the carotid artery.  Based on these findings, this would be less than  50% stenosis and not hemodynamically significant.   MEDICAL DECISION MAKING:  This is a 40 year old gentleman with a history  suggestive of possible traumatic injury to the left internal carotid  artery from the common carotid up to the internal carotid  artery.  Based on a literature search, there are no case reports of focal  vasculitic involvement of the carotid artery.  Hence the differential  diagnosis for this patient's CT finding include a carotid dissection or  focal intramural hematoma  with a trauma mechanism of injury.  In most  young patients, either one, will resolve with time.  I recommended that  the pt continue with aspirin use for now.  I do not expect any sequalae  from his carotid pathology.  However, there is a small risk of  aneurysmal degeneration from either pathology, so in 6 month, I would  repeat a CTA neck and chest to re-evaluate the carotids and aortic arch.  I will see him after that study is available in 6 months.     Leonides Sake, MD  Electronically Signed   BC/MEDQ  D:  10/08/2009  T:  10/11/2009  Job:  [id number]
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
## 2010-05-20 NOTE — Procedures (Signed)
Victor Patterson, Victor Patterson           ACCOUNT NO.:  [id number]   MEDICAL RECORD NO.:  [id number]          PATIENT TYPE:  OUT   LOCATION:  SLEEP CENTER                 FACILITY[facility]  [facility]   PHYSICIAN:  Clinton D. Maple Hudson, MD, FCCP, FACPDATE OF BIRTH:  06/06/68   DATE OF STUDY:  04/02/2006                            NOCTURNAL POLYSOMNOGRAM   REFERRING PHYSICIAN:   REFERRING PHYSICIAN:  Lucky Cowboy, M.D.   INDICATION FOR STUDY:  Hypersomnia with sleep apnea.  Epworth sleepiness  score 12/24, BMI 23.7, weight 195 pounds.   HOME MEDICATIONS:  Focalin XR 20 mg.   SLEEP ARCHITECTURE:  Total sleep time 334 minutes with sleep efficiency  89%.  Stage I was 6%, stage II 55%, stages III and IV 16%, REM 23% of  total sleep time.  Sleep latency nine minutes, REM latency 65 minutes.  Awake after sleep onset 33 minutes, arousal index 10.  No bedtime  medication taken.   RESPIRATORY DATA:  Apnea/hypopnea index (AHI, RDI) 4.5 obstructive  events per hour, which is within adult normal limits (normal range 0-5  per hour).  There were 13 obstructive apneas and 12 hypopneas.  Events  were not positional, somewhat more common while sleeping on right side.  REM AHI 10.2.   OXYGEN DATA:  Mild snoring with oxygen desaturation to a nadir of 89%.  Mean oxygen saturation through the study was 95% on room air.   CARDIAC DATA:  Normal sinus rhythm.   MOVEMENT/PARASOMNIA:  Occasional limb jerk, insignificant.  No bathroom  trips.   IMPRESSION/RECOMMENDATION:  1. Note that the patient takes a stimulant medication during the      daytime with potential carryover to sleep.  2. Occasional sleep disordered breathing within normal limits, AHI 4.5      per hour (normal range 0-5 per hour).  Events were not positional.      Mild snoring with oxygen desaturation to a nadir of 89%.     Clinton D. Maple Hudson, MD, [facility], FACP  Diplomate, Biomedical engineer of Sleep Medicine  Electronically Signed    CDY/MEDQ  D:  04/08/2006  12:42:45  T:  04/08/2006 22:42:46  Job:  [id number]

## 2010-05-26 ENCOUNTER — Ambulatory Visit (AMBULATORY_SURGERY_CENTER): Payer: BC Managed Care – PPO | Admitting: Internal Medicine

## 2010-05-26 ENCOUNTER — Encounter: Payer: Self-pay | Admitting: Internal Medicine

## 2010-05-26 VITALS — BP 144/77 | HR 77 | Temp 97.2°F | Resp 18 | Ht 76.0 in | Wt 221.0 lb

## 2010-05-26 DIAGNOSIS — Z83719 Family history of colon polyps, unspecified: Secondary | ICD-10-CM

## 2010-05-26 DIAGNOSIS — Z800 Family history of malignant neoplasm of digestive organs: Secondary | ICD-10-CM

## 2010-05-26 DIAGNOSIS — Z8371 Family history of colonic polyps: Secondary | ICD-10-CM

## 2010-05-26 DIAGNOSIS — K648 Other hemorrhoids: Secondary | ICD-10-CM

## 2010-05-26 DIAGNOSIS — Z1211 Encounter for screening for malignant neoplasm of colon: Secondary | ICD-10-CM

## 2010-05-26 DIAGNOSIS — K649 Unspecified hemorrhoids: Secondary | ICD-10-CM

## 2010-05-26 MED ORDER — SODIUM CHLORIDE 0.9 % IV SOLN: 500.0000 mL | INTRAVENOUS | Status: DC

## 2010-05-26 NOTE — Patient Instructions (Signed)
Please refer to blue and green discharge instruction sheets. 

## 2010-05-27 ENCOUNTER — Telehealth: Payer: Self-pay | Admitting: *Deleted

## 2010-05-27 NOTE — Telephone Encounter (Signed)
Left message for patient to call us if necessary.

## 2010-06-07 DIAGNOSIS — M542 Cervicalgia: Secondary | ICD-10-CM | POA: Insufficient documentation

## 2010-06-07 NOTE — Assessment & Plan Note (Signed)
Refer to dermatology for further evaluation. 
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
## 2010-08-18 NOTE — Progress Notes (Signed)
Addended by: Virgel Paling on: 08/18/2010 11:44 AM   Modules accepted: Orders

## 2010-08-29 ENCOUNTER — Encounter: Payer: Self-pay | Admitting: Internal Medicine

## 2010-08-29 ENCOUNTER — Ambulatory Visit (INDEPENDENT_AMBULATORY_CARE_PROVIDER_SITE_OTHER): Payer: BC Managed Care – PPO | Admitting: Internal Medicine

## 2010-08-29 DIAGNOSIS — S91109A Unspecified open wound of unspecified toe(s) without damage to nail, initial encounter: Secondary | ICD-10-CM

## 2010-08-29 DIAGNOSIS — IMO0002 Reserved for concepts with insufficient information to code with codable children: Secondary | ICD-10-CM

## 2010-08-29 MED ORDER — IBUPROFEN 800 MG PO TABS: 800.0000 mg | ORAL_TABLET | Freq: Three times a day (TID) | ORAL | Status: AC | PRN

## 2010-08-29 MED ORDER — HYDROCODONE-ACETAMINOPHEN 5-500 MG PO TABS: 1.0000 | ORAL_TABLET | Freq: Four times a day (QID) | ORAL | Status: AC | PRN

## 2010-08-31 ENCOUNTER — Encounter: Payer: BC Managed Care – PPO | Admitting: Internal Medicine

## 2010-09-01 ENCOUNTER — Ambulatory Visit (INDEPENDENT_AMBULATORY_CARE_PROVIDER_SITE_OTHER): Payer: BC Managed Care – PPO | Admitting: Internal Medicine

## 2010-09-01 ENCOUNTER — Encounter: Payer: Self-pay | Admitting: Internal Medicine

## 2010-09-01 VITALS — BP 114/70 | HR 88 | Temp 98.1°F | Resp 18 | Ht 76.0 in | Wt 219.0 lb

## 2010-09-01 DIAGNOSIS — Z0000 Encounter for general adult medical examination without abnormal findings: Secondary | ICD-10-CM | POA: Insufficient documentation

## 2010-09-01 DIAGNOSIS — H6120 Impacted cerumen, unspecified ear: Secondary | ICD-10-CM

## 2010-09-01 NOTE — Progress Notes (Signed)
  Subjective:    Patient ID: Victor Patterson, male    DOB: 10-Dec-1970, 40 y.o.   MRN: [id number]  HPI Pt presents to clinic for annual physical. Seen by podiatry two days ago with partially avulsed toe nail which was removed in office. No other complaints.  Past Medical History  Diagnosis Date  . ADD (attention deficit disorder)     see psych  . Depression     see psych  . Hyperlipidemia     not on meds  . Deviated septum   . Eustachian tube dysfunction     right  . Proctalgia fugax   . Herniated disc     lower back -has seen ortho in the past and had steroid shots 2005  . Elevated blood-pressure reading without diagnosis of hypertension     resolved  . Liver hemangioma 06/2009  . Rib fracture 06/2009    8th and 9th  . 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 Colonoscopy 2006 and 05/2010    hemorrhoids    reports that he has never smoked. He has never used smokeless tobacco. He reports that he drinks about 1.2 ounces of alcohol per week. He reports that he does not use illicit drugs. family history includes Colon cancer in his maternal grandfather and unspecified family member; Coronary artery disease in an unspecified family member; Diabetes in his maternal uncle; and Other in his father and unspecified family member. No Known Allergies     Review of Systems see hpi     Objective:   Physical Exam  Nursing note and vitals reviewed. Constitutional: He appears well-developed and well-nourished. No distress.  HENT:  Head: Normocephalic and atraumatic.  Right Ear: Tympanic membrane and external ear normal.  Left Ear: Tympanic membrane and external ear normal.  Nose: Nose normal.  Mouth/Throat: Uvula is midline, oropharynx is clear and moist and mucous membranes are normal. No oropharyngeal exudate.  Eyes: Conjunctivae and EOM are normal. Pupils are equal, round, and reactive to light. Right eye exhibits no discharge.  Left eye exhibits no discharge. No scleral icterus.  Neck: Neck supple. Carotid bruit is not present. No thyromegaly present.  Cardiovascular: Normal rate, regular rhythm and normal heart sounds.  Exam reveals no gallop and no friction rub.   No murmur heard. Pulmonary/Chest: Effort normal and breath sounds normal. No respiratory distress. He has no wheezes. He has no rales.  Abdominal: Soft. He exhibits no distension and no mass. There is no hepatosplenomegaly. There is no tenderness. There is no rebound. Hernia confirmed negative in the right inguinal area and confirmed negative in the left inguinal area.  Genitourinary: Rectum normal, prostate normal and testes normal. Rectal exam shows no mass and no tenderness. Guaiac negative stool. Prostate is not enlarged and not tender. Right testis shows no mass, no swelling and no tenderness. Right testis is descended. Left testis shows no mass, no swelling and no tenderness. Left testis is descended.  Lymphadenopathy:    He has no cervical adenopathy.       Right: No inguinal adenopathy present.       Left: No inguinal adenopathy present.  Neurological: He is alert.  Skin: Skin is warm and dry. He is not diaphoretic.  Psychiatric: He has a normal mood and affect.          Assessment & Plan:

## 2010-09-01 NOTE — Patient Instructions (Signed)
Please return for fasting labs tomorrow 8/31: cbc, chem7, lipid, lft, tsh, ua (v70.0)

## 2010-09-01 NOTE — Assessment & Plan Note (Signed)
Nl exam. Obtain cpe labs. EKG obtained demonstrates nsr 75 with nl interval and axis.

## 2010-09-02 LAB — URINALYSIS
Bilirubin Urine: NEGATIVE
Glucose, UA: NEGATIVE mg/dL
Ketones, ur: NEGATIVE mg/dL
Specific Gravity, Urine: 1.023 (ref 1.005–1.030)
Urobilinogen, UA: 0.2 mg/dL (ref 0.0–1.0)

## 2010-09-02 LAB — HEPATIC FUNCTION PANEL
AST: 19 U/L (ref 0–37)
Albumin: 4.7 g/dL (ref 3.5–5.2)
Alkaline Phosphatase: 94 U/L (ref 39–117)
Total Bilirubin: 1.2 mg/dL (ref 0.3–1.2)
Total Protein: 7.4 g/dL (ref 6.0–8.3)

## 2010-09-02 LAB — CBC
MCH: 27.7 pg (ref 26.0–34.0)
MCHC: 33.5 g/dL (ref 30.0–36.0)
MCV: 82.8 fL (ref 78.0–100.0)
Platelets: 280 10*3/uL (ref 150–400)
RDW: 13.6 % (ref 11.5–15.5)

## 2010-09-02 LAB — LIPID PANEL
HDL: 37 mg/dL — ABNORMAL LOW (ref >39)
Triglycerides: 269 mg/dL — ABNORMAL HIGH (ref <150)

## 2010-09-02 LAB — BASIC METABOLIC PANEL
Calcium: 9.8 mg/dL (ref 8.4–10.5)
Creat: 0.9 mg/dL (ref 0.50–1.35)

## 2010-09-11 DIAGNOSIS — IMO0002 Reserved for concepts with insufficient information to code with codable children: Secondary | ICD-10-CM | POA: Insufficient documentation

## 2010-09-11 NOTE — Progress Notes (Signed)
  Subjective:    Patient ID: Victor Patterson, male    DOB: 1970/05/02, 40 y.o.   MRN: [id number]  HPI Pt presents to clinic as a work in for evaluation of    Past Medical History  Diagnosis Date  . ADD (attention deficit disorder)     see psych  . Depression     see psych  . Hyperlipidemia     not on meds  . Deviated septum   . Eustachian tube dysfunction     right  . Proctalgia fugax   . Herniated disc     lower back -has seen ortho in the past and had steroid shots 2005  . Elevated blood-pressure reading without diagnosis of hypertension     resolved  . Liver hemangioma 06/2009  . Rib fracture 06/2009    8th and 9th  . 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 Colonoscopy 2006 and 05/2010    hemorrhoids    reports that he has never smoked. He has never used smokeless tobacco. He reports that he drinks about 1.2 ounces of alcohol per week. He reports that he does not use illicit drugs. family history includes Colon cancer in his maternal grandfather and unspecified family member; Coronary artery disease in an unspecified family member; Diabetes in his maternal uncle; and Other in his father and unspecified family member. No Known Allergies   Review of Systems see hpi     Objective:   Physical Exam  Nursing note and vitals reviewed. Constitutional: He appears well-developed and well-nourished. No distress.  HENT:  Head: Normocephalic and atraumatic.  Eyes: Conjunctivae are normal. No scleral icterus.  Skin: Skin is warm. He is not diaphoretic.       1st toe nail almost fully avulsed. +tender. No bleeding          Assessment & Plan:

## 2010-09-11 NOTE — Assessment & Plan Note (Signed)
Involves nail matrix. Podiatry consult.

## 2011-01-17 ENCOUNTER — Encounter: Payer: Self-pay | Admitting: Internal Medicine

## 2011-01-17 ENCOUNTER — Ambulatory Visit (INDEPENDENT_AMBULATORY_CARE_PROVIDER_SITE_OTHER): Payer: BC Managed Care – PPO | Admitting: Internal Medicine

## 2011-01-17 DIAGNOSIS — R000 Tachycardia, unspecified: Secondary | ICD-10-CM

## 2011-01-21 DIAGNOSIS — R000 Tachycardia, unspecified: Secondary | ICD-10-CM | POA: Insufficient documentation

## 2011-01-21 NOTE — Progress Notes (Signed)
  Subjective:    Patient ID: Victor Patterson, male    DOB: 1970/03/08, 41 y.o.   MRN: [id number]  HPI Pt presents to clinic for discussion of exercise. Began a regular exercise program involving walk/run intervals. Has monitored HR during exercise and notes tachycardia with heartrates sometimes in upper 170's to low 180's. May exercise for up to 1 hour and 40 minutes at a time. Feels well during the exercise without adverse effect. No other complaints. Total time of visit 32 minutes of which >50% spent in counseling.  Past Medical History  Diagnosis Date  . ADD (attention deficit disorder)     see psych  . Depression     see psych  . Hyperlipidemia     not on meds  . Deviated septum   . Eustachian tube dysfunction     right  . Proctalgia fugax   . Herniated disc     lower back -has seen ortho in the past and had steroid shots 2005  . Elevated blood-pressure reading without diagnosis of hypertension     resolved  . Liver hemangioma 06/2009  . Rib fracture 06/2009    8th and 9th  . 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 Colonoscopy 2006 and 05/2010    hemorrhoids    reports that he has never smoked. He has never used smokeless tobacco. He reports that he drinks about 1.2 ounces of alcohol per week. He reports that he does not use illicit drugs. family history includes Colon cancer in his maternal grandfather and unspecified family member; Coronary artery disease in an unspecified family member; Diabetes in his maternal uncle; and Other in his father and unspecified family member. No Known Allergies   Review of Systems     Objective:   Physical Exam  Nursing note and vitals reviewed. Constitutional: He appears well-developed and well-nourished. No distress.  Neurological: He is alert.  Skin: He is not diaphoretic.  Psychiatric: He has a normal mood and affect.          Assessment & Plan:

## 2011-01-21 NOTE — Assessment & Plan Note (Signed)
Encouraged in exercise however recommend slower progression and avoidance of near maximal heart rates

## 2011-03-14 ENCOUNTER — Ambulatory Visit: Payer: BC Managed Care – PPO | Admitting: Internal Medicine

## 2011-04-03 ENCOUNTER — Telehealth: Payer: Self-pay | Admitting: Internal Medicine

## 2011-04-03 MED ORDER — IBUPROFEN 800 MG PO TABS: 800.0000 mg | ORAL_TABLET | Freq: Three times a day (TID) | ORAL | Status: AC | PRN

## 2011-04-03 MED ORDER — CYCLOBENZAPRINE HCL 10 MG PO TABS: 10.0000 mg | ORAL_TABLET | Freq: Three times a day (TID) | ORAL | Status: AC | PRN

## 2011-04-03 NOTE — Telephone Encounter (Signed)
Patient states that he is having back pain again and would like to know if Dr. Rodena Medin would call him in more muscle relaxers to [facility] on [address].

## 2011-04-03 NOTE — Telephone Encounter (Signed)
Call placed to patient at [phone], he was informed of Rx to pharmacy. Patient stated that he forgot to ask for Ibuprofen. He would like to know if Dr Rodena Medin would prescribe 600- 800 mg of Ibuprofen for him.

## 2011-04-03 NOTE — Telephone Encounter (Signed)
800mg  po tid prn #30

## 2011-04-03 NOTE — Telephone Encounter (Signed)
Flexeril 10mg  po tid prn #30 no rf.

## 2011-04-03 NOTE — Telephone Encounter (Signed)
Rx sent to pharmacy   

## 2011-04-06 ENCOUNTER — Telehealth: Payer: Self-pay | Admitting: Internal Medicine

## 2011-04-06 NOTE — Telephone Encounter (Signed)
Call placed to patient at [phone], he states he has checked with his insurance, and was informed they will cover up to 25 visits. He stated that he was not really looking for a referral but a recommendation on some one in the area.

## 2011-04-06 NOTE — Telephone Encounter (Signed)
i apologize but don't know any chiropractors specifically.

## 2011-04-06 NOTE — Telephone Encounter (Signed)
Don't mind doing it but in a lot of cases chiropractic care isn't covered by insurance and doesn't require referral

## 2011-04-06 NOTE — Telephone Encounter (Signed)
Patient states that he would like to have a referral to a chiropractor for his back pain.

## 2011-04-07 NOTE — Telephone Encounter (Signed)
Call placed to patient at [phone], no answer. A detailed voice message was left informing patient per Dr Rodena Medin instructions.

## 2011-04-11 ENCOUNTER — Encounter: Payer: Self-pay | Admitting: Internal Medicine

## 2011-04-11 ENCOUNTER — Ambulatory Visit (INDEPENDENT_AMBULATORY_CARE_PROVIDER_SITE_OTHER): Payer: BC Managed Care – PPO | Admitting: Internal Medicine

## 2011-04-11 VITALS — BP 124/80 | HR 79 | Temp 98.0°F | Resp 18 | Ht 76.0 in | Wt 217.0 lb

## 2011-04-11 DIAGNOSIS — M5126 Other intervertebral disc displacement, lumbar region: Secondary | ICD-10-CM

## 2011-04-11 MED ORDER — PREDNISONE 10 MG PO TABS: ORAL_TABLET | ORAL | Status: AC

## 2011-04-11 NOTE — Assessment & Plan Note (Signed)
Attempt prednisone taper. Hold nsaid. Followup if no improvement or worsening.

## 2011-04-11 NOTE — Progress Notes (Signed)
  Subjective:    Patient ID: Victor Patterson, male    DOB: 03-Jun-1970, 41 y.o.   MRN: [id number]  HPI Pt presents to clinic for evaluation of back pain. Recent flare of lbp with radiation to buttock after abdominal crunches. No radicular leg pain, paresthesia or weakness. Underwent mri 2005 with extruded disc. Underwent steroid injxns with years of relief. Taking motrin 800mg  tid without gi adverse effect. No other complaint. Total time of visit 22 minutes of which >50% spent in counseling  Past Medical History  Diagnosis Date  . ADD (attention deficit disorder)     see psych  . Depression     see psych  . Hyperlipidemia     not on meds  . Deviated septum   . Eustachian tube dysfunction     right  . Proctalgia fugax   . Herniated disc     lower back -has seen ortho in the past and had steroid shots 2005  . Elevated blood-pressure reading without diagnosis of hypertension     resolved  . Liver hemangioma 06/2009  . Rib fracture 06/2009    8th and 9th  . 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 Colonoscopy 2006 and 05/2010    hemorrhoids    reports that he has never smoked. He has never used smokeless tobacco. He reports that he drinks about 1.2 ounces of alcohol per week. He reports that he does not use illicit drugs. family history includes Colon cancer in his maternal grandfather and unspecified family member; Coronary artery disease in an unspecified family member; Diabetes in his maternal uncle; and Other in his father and unspecified family member. No Known Allergies   Review of Systems see hpi     Objective:   Physical Exam  Nursing note and vitals reviewed. Constitutional: He appears well-developed and well-nourished. No distress.  Neurological: He is alert.       Bilateral le strength 5/5. Gait nl  Skin: He is not diaphoretic.          Assessment & Plan:
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
## 2011-07-31 IMAGING — CR DG RIBS 2V*R*
3 series · 3 of 3 positions shown · non-contrast
Comparison: Chest x-ray 11/20/2006

CLINICAL DATA: Right chest wall pain following injury

RIGHT RIBS - 2 VIEW

[w ribs ap/pa upper right]
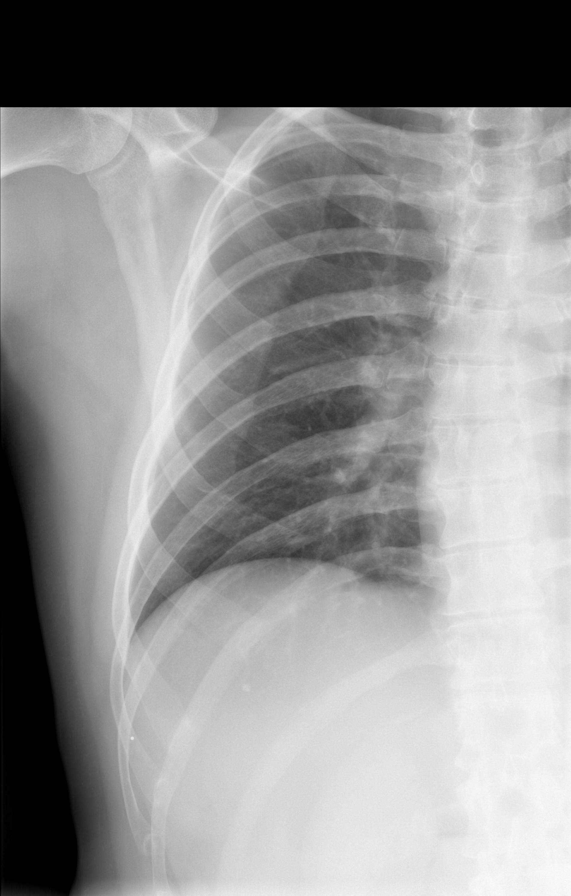

[w ribs ap/pa lower right]
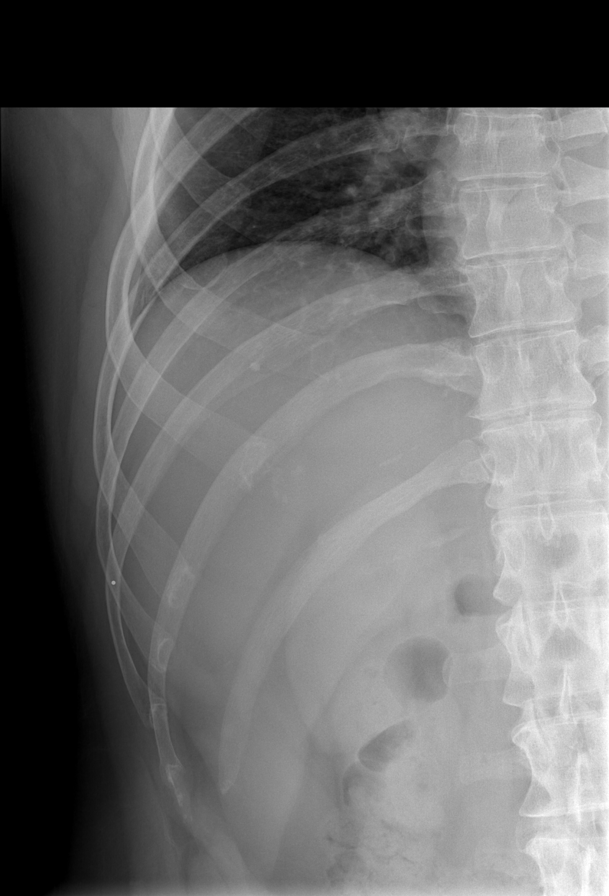

[w ribs oblique right]
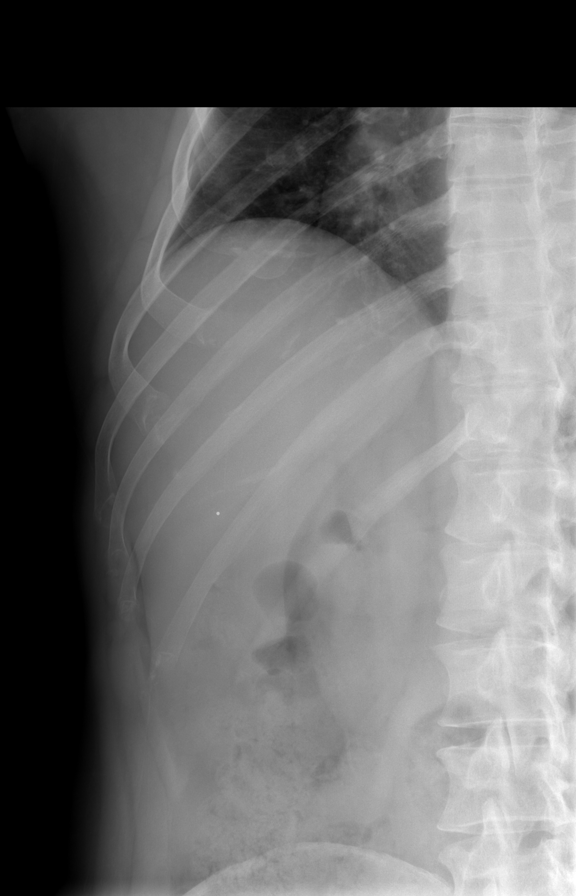

[3 of 3 positions shown; findings below may reference images not displayed]

FINDINGS: Heart and lungs normal.  No rib fractures or lesions.
No pneumothorax or hemothorax.
IMPRESSION: No acute or significant findings.

## 2011-11-17 ENCOUNTER — Encounter: Payer: BC Managed Care – PPO | Admitting: Internal Medicine

## 2011-11-24 ENCOUNTER — Encounter: Payer: Self-pay | Admitting: Internal Medicine

## 2011-11-24 ENCOUNTER — Ambulatory Visit (INDEPENDENT_AMBULATORY_CARE_PROVIDER_SITE_OTHER): Payer: BC Managed Care – PPO | Admitting: Internal Medicine

## 2011-11-24 VITALS — BP 124/86 | HR 94 | Temp 98.3°F | Resp 16 | Wt 210.5 lb

## 2011-11-24 DIAGNOSIS — R413 Other amnesia: Secondary | ICD-10-CM | POA: Insufficient documentation

## 2011-11-24 NOTE — Progress Notes (Signed)
  Subjective:    Patient ID: Victor Patterson, male    DOB: August 27, 1970, 41 y.o.   MRN: [id number]  HPI patient presents to clinic for evaluation of decreasing concentration and memory. States has been noticing progressive difficulty with decreased concentration, focus as well as memory and recall. Has become severe enough that his affecting his work. His pulse has noticed this as well. He does carry a diagnosis of ADD and is followed by psychiatry with a stable dose of medication. In discussing this with him does not appear that he feels this is related. Has no focal neurologic complaints/deficits. No alleviating or exacerbating factors. Total time of visit approximately 28 minutes of which greater than 50% of time was spent in counseling.  Past Medical History  Diagnosis Date  . ADD (attention deficit disorder)     see psych  . Depression     see psych  . Hyperlipidemia     not on meds  . Deviated septum   . Eustachian tube dysfunction     right  . Proctalgia fugax   . Herniated disc     lower back -has seen ortho in the past and had steroid shots 2005  . Elevated blood-pressure reading without diagnosis of hypertension     resolved  . Liver hemangioma 06/2009  . Rib fracture 06/2009    8th and 9th  . 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 Colonoscopy 2006 and 05/2010    hemorrhoids    reports that he has never smoked. He has never used smokeless tobacco. He reports that he drinks about 1.2 ounces of alcohol per week. He reports that he does not use illicit drugs. family history includes Colon cancer in his maternal grandfather and unspecified family member; Coronary artery disease in an unspecified family member; Diabetes in his maternal uncle; and Other in his father and unspecified family member. No Known Allergies   Review of Systems see history of present illness     Objective:   Physical Exam  Nursing note and vitals  reviewed. Constitutional: He appears well-developed and well-nourished. No distress.  HENT:  Head: Normocephalic and atraumatic.  Eyes: Conjunctivae normal and EOM are normal. Pupils are equal, round, and reactive to light. No scleral icterus.  Neck: Neck supple.  Neurological: He is alert. No cranial nerve deficit. Coordination normal.  Skin: Skin is warm and dry. He is not diaphoretic.  Psychiatric: He has a normal mood and affect.          Assessment & Plan:

## 2011-11-24 NOTE — Assessment & Plan Note (Signed)
Memory loss with associated decrease in concentration and focus. Neurologically nonfocal. Discussed further evaluation including cranial imaging vs. neurology consult. Will proceed with neurology consult

## 2011-12-04 ENCOUNTER — Encounter: Payer: Self-pay | Admitting: Internal Medicine

## 2011-12-04 ENCOUNTER — Ambulatory Visit (INDEPENDENT_AMBULATORY_CARE_PROVIDER_SITE_OTHER): Payer: BC Managed Care – PPO | Admitting: Internal Medicine

## 2011-12-04 VITALS — BP 124/84 | HR 73 | Temp 98.0°F | Resp 16 | Ht 76.0 in | Wt 211.8 lb

## 2011-12-04 DIAGNOSIS — Z0000 Encounter for general adult medical examination without abnormal findings: Secondary | ICD-10-CM

## 2011-12-04 DIAGNOSIS — R413 Other amnesia: Secondary | ICD-10-CM

## 2011-12-04 LAB — CBC WITH DIFFERENTIAL/PLATELET
Basophils Absolute: 0.1 10*3/uL (ref 0.0–0.1)
Basophils Relative: 1 % (ref 0–1)
Hemoglobin: 14.6 g/dL (ref 13.0–17.0)
MCHC: 35.1 g/dL (ref 30.0–36.0)
Neutro Abs: 3.2 10*3/uL (ref 1.7–7.7)
Neutrophils Relative %: 51 % (ref 43–77)
RDW: 13.8 % (ref 11.5–15.5)

## 2011-12-04 LAB — TSH: TSH: 1.816 u[IU]/mL (ref 0.350–4.500)

## 2011-12-04 LAB — LIPID PANEL
Cholesterol: 222 mg/dL — ABNORMAL HIGH (ref 0–200)
Total CHOL/HDL Ratio: 4.8 Ratio
Triglycerides: 145 mg/dL (ref <150)
VLDL: 29 mg/dL (ref 0–40)

## 2011-12-04 LAB — BASIC METABOLIC PANEL
Potassium: 4.5 mEq/L (ref 3.5–5.3)
Sodium: 140 mEq/L (ref 135–145)

## 2011-12-04 LAB — HEPATIC FUNCTION PANEL
ALT: 17 U/L (ref 0–53)
Indirect Bilirubin: 1.2 mg/dL — ABNORMAL HIGH (ref 0.0–0.9)
Total Protein: 7.3 g/dL (ref 6.0–8.3)

## 2011-12-04 NOTE — Progress Notes (Signed)
Subjective:    Patient ID: Victor Patterson, male    DOB: [birth year], 41 y.o.   MRN: [id number]  HPI Pt presents to clinic for annual physical. Received influenza vaccine for the season. Recent neurology consult appt not available until the spring-interested in being seen locally. Pursuing continued regular exercise as well as reduction of meat intake.   Past Medical History  Diagnosis Date  . ADD (attention deficit disorder)     see psych  . Depression     see psych  . Hyperlipidemia     not on meds  . Deviated septum   . Eustachian tube dysfunction     right  . Proctalgia fugax   . Herniated disc     lower back -has seen ortho in the past and had steroid shots 2005  . Elevated blood-pressure reading without diagnosis of hypertension     resolved  . Liver hemangioma 06/2009  . Rib fracture 06/2009    8th and 9th  . 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 Colonoscopy 2006 and 05/2010    hemorrhoids    reports that he has never smoked. He has never used smokeless tobacco. He reports that he drinks about 1.2 ounces of alcohol per week. He reports that he does not use illicit drugs. family history includes Colon cancer in his maternal grandfather and unspecified family member; Coronary artery disease in an unspecified family member; Diabetes in his maternal uncle; and Other in his father and unspecified family member. No Known Allergies    Review of Systems see hpi    Objective:   Physical Exam  Physical Exam  Nursing note and vitals reviewed. Constitutional: He appears well-developed and well-nourished. No distress.  HENT:  Head: Normocephalic and atraumatic.  Right Ear: Tympanic membrane and external ear normal.  Left Ear: Tympanic membrane and external ear normal.  Nose: Nose normal.  Mouth/Throat: Uvula is midline, oropharynx is clear and moist and mucous membranes are normal. No oropharyngeal exudate.  Eyes:  Conjunctivae and EOM are normal. Pupils are equal, round, and reactive to light. Right eye exhibits no discharge. Left eye exhibits no discharge. No scleral icterus.  Neck: Neck supple. Carotid bruit is not present. No thyromegaly present.  Cardiovascular: Normal rate, regular rhythm and normal heart sounds.  Exam reveals no gallop and no friction rub.   No murmur heard. Pulmonary/Chest: Effort normal and breath sounds normal. No respiratory distress. He has no wheezes. He has no rales.  Abdominal: Soft. He exhibits no distension and no mass. There is no hepatosplenomegaly. There is no tenderness. There is no rebound. Hernia confirmed negative in the left inguinal area. right medial inguinal area with soft tissue prominence with valsalva but no discrete hernia appreciated.  Genitourinary: Rectum normal, prostate normal and testes normal. Rectal exam shows no mass and no tenderness. Guaiac negative stool. Prostate is not enlarged and not tender. Right testis shows no mass, no swelling and no tenderness. Right testis is descended. Left testis shows no mass, no swelling and no tenderness. Left testis is descended.  Lymphadenopathy:    He has no cervical adenopathy.       Right: No inguinal adenopathy present.       Left: No inguinal adenopathy present.  Neurological: He is alert.  Skin: Skin is warm and dry. He is not diaphoretic.  Psychiatric: He has a normal mood and affect.        Assessment &  Plan:

## 2011-12-04 NOTE — Assessment & Plan Note (Signed)
Nl exam. Obtain cpe labs. Redo neurology referral for local.

## 2011-12-05 LAB — URINALYSIS, ROUTINE W REFLEX MICROSCOPIC
Bilirubin Urine: NEGATIVE
Glucose, UA: NEGATIVE mg/dL
Hgb urine dipstick: NEGATIVE
Ketones, ur: NEGATIVE mg/dL
Leukocytes, UA: NEGATIVE
Nitrite: NEGATIVE
Protein, ur: NEGATIVE mg/dL
Specific Gravity, Urine: 1.023 (ref 1.005–1.030)
Urobilinogen, UA: 0.2 mg/dL (ref 0.0–1.0)
pH: 6 (ref 5.0–8.0)

## 2011-12-08 ENCOUNTER — Encounter: Payer: Self-pay | Admitting: *Deleted

## 2011-12-08 ENCOUNTER — Telehealth: Payer: Self-pay | Admitting: *Deleted

## 2011-12-08 DIAGNOSIS — E785 Hyperlipidemia, unspecified: Secondary | ICD-10-CM

## 2011-12-08 NOTE — Telephone Encounter (Signed)
Pt informed, understood & agreed; future lab orders placed, copy of results mailed at pt request/SLS

## 2011-12-08 NOTE — Telephone Encounter (Signed)
Message copied by Regis Bill on Fri Dec 08, 2011 10:48 AM ------      Message from: Edwyna Perfect      Created: Thu Dec 07, 2011  7:02 PM       Interesting ldl chol is high this time. Repeat lipid 272.4 in 3 months

## 2012-01-08 ENCOUNTER — Encounter: Payer: Self-pay | Admitting: Vascular Surgery

## 2012-02-13 DIAGNOSIS — F411 Generalized anxiety disorder: Secondary | ICD-10-CM

## 2012-02-13 DIAGNOSIS — F909 Attention-deficit hyperactivity disorder, unspecified type: Secondary | ICD-10-CM

## 2012-02-20 DIAGNOSIS — F411 Generalized anxiety disorder: Secondary | ICD-10-CM

## 2012-02-20 DIAGNOSIS — F909 Attention-deficit hyperactivity disorder, unspecified type: Secondary | ICD-10-CM

## 2012-02-22 DIAGNOSIS — F411 Generalized anxiety disorder: Secondary | ICD-10-CM

## 2012-02-22 DIAGNOSIS — F909 Attention-deficit hyperactivity disorder, unspecified type: Secondary | ICD-10-CM

## 2012-03-07 DIAGNOSIS — F909 Attention-deficit hyperactivity disorder, unspecified type: Secondary | ICD-10-CM

## 2012-03-07 DIAGNOSIS — F411 Generalized anxiety disorder: Secondary | ICD-10-CM

## 2012-03-29 ENCOUNTER — Ambulatory Visit (INDEPENDENT_AMBULATORY_CARE_PROVIDER_SITE_OTHER): Payer: Self-pay | Admitting: Family Medicine

## 2012-03-29 ENCOUNTER — Encounter: Payer: Self-pay | Admitting: Family Medicine

## 2012-03-29 VITALS — BP 100/62 | HR 75 | Temp 98.0°F | Wt 209.0 lb

## 2012-03-29 DIAGNOSIS — F419 Anxiety disorder, unspecified: Secondary | ICD-10-CM

## 2012-03-29 DIAGNOSIS — R079 Chest pain, unspecified: Secondary | ICD-10-CM

## 2012-03-29 DIAGNOSIS — F411 Generalized anxiety disorder: Secondary | ICD-10-CM

## 2012-03-29 DIAGNOSIS — R0602 Shortness of breath: Secondary | ICD-10-CM

## 2012-03-29 LAB — BASIC METABOLIC PANEL
CO2: 26 mEq/L (ref 19–32)
Calcium: 9.5 mg/dL (ref 8.4–10.5)
Chloride: 99 mEq/L (ref 96–112)
Sodium: 136 mEq/L (ref 135–145)

## 2012-03-29 LAB — CBC WITH DIFFERENTIAL/PLATELET
Basophils Relative: 0.5 % (ref 0.0–3.0)
Eosinophils Absolute: 0.2 10*3/uL (ref 0.0–0.7)
Eosinophils Relative: 2.7 % (ref 0.0–5.0)
Hemoglobin: 14.2 g/dL (ref 13.0–17.0)
Lymphocytes Relative: 31.3 % (ref 12.0–46.0)
MCHC: 34 g/dL (ref 30.0–36.0)
Neutro Abs: 4.1 10*3/uL (ref 1.4–7.7)
RBC: 5.02 Mil/uL (ref 4.22–5.81)
WBC: 6.9 10*3/uL (ref 4.5–10.5)

## 2012-03-29 LAB — HEPATIC FUNCTION PANEL
ALT: 18 U/L (ref 0–53)
AST: 17 U/L (ref 0–37)
Albumin: 4.3 g/dL (ref 3.5–5.2)
Alkaline Phosphatase: 91 U/L (ref 39–117)
Bilirubin, Direct: 0.2 mg/dL (ref 0.0–0.3)
Total Protein: 7.6 g/dL (ref 6.0–8.3)

## 2012-03-29 MED ORDER — ALPRAZOLAM 0.25 MG PO TABS: ORAL_TABLET | ORAL | Status: DC

## 2012-03-29 NOTE — Patient Instructions (Addendum)

## 2012-03-31 NOTE — Progress Notes (Signed)
  Subjective:    Victor Patterson is a 42 y.o. male who presents with palpitations and shortness of breath. The symptoms are moderate, occur intermittently, and last several  hours per episode. They tend to occur while at rest. Cardiac risk factors include: male gender. Aggravating factors: none. Relieving factors: none. Associated symptoms: palpitations and shortness of breath. Patient denies: abdominal pain, calf pain, chest pain, cough, dizziness, fatigue, leg swelling, slow heart beat and syncope.  The following portions of the patient's history were reviewed and updated as appropriate: allergies, current medications, past family history, past medical history, past social history, past surgical history and problem list.  Review of Systems Pertinent items are noted in HPI.   Objective:    BP 100/62  Pulse 75  Temp(Src) 98 F (36.7 C) (Oral)  Wt 209 lb (94.802 kg)  BMI 25.45 kg/m2  SpO2 98% General appearance: alert, cooperative, appears stated age and no distress Neck: no adenopathy, supple, symmetrical, trachea midline and thyroid not enlarged, symmetric, no tenderness/mass/nodules Lungs: clear to auscultation bilaterally Heart: S1, S2 normal Neurologic: Grossly normal Psych-- anxious, not suicidal  Cardiographics ECG: normal sinus rhythm   Assessment:    palpitations and sob   Plan:    anxiety very possible Check labs Xanax prn-- f/u 1 month,  Consider SSRI if xanax helps Follow up in 1 month.

## 2012-04-16 ENCOUNTER — Ambulatory Visit (INDEPENDENT_AMBULATORY_CARE_PROVIDER_SITE_OTHER): Payer: BC Managed Care – PPO | Admitting: Cardiovascular Disease

## 2012-04-16 ENCOUNTER — Encounter: Payer: Self-pay | Admitting: Cardiovascular Disease

## 2012-04-16 VITALS — BP 125/81 | HR 69 | Ht 76.0 in | Wt 210.0 lb

## 2012-04-16 DIAGNOSIS — F988 Other specified behavioral and emotional disorders with onset usually occurring in childhood and adolescence: Secondary | ICD-10-CM

## 2012-04-16 DIAGNOSIS — R0602 Shortness of breath: Secondary | ICD-10-CM

## 2012-04-16 DIAGNOSIS — R0989 Other specified symptoms and signs involving the circulatory and respiratory systems: Secondary | ICD-10-CM

## 2012-04-16 DIAGNOSIS — E785 Hyperlipidemia, unspecified: Secondary | ICD-10-CM

## 2012-04-16 DIAGNOSIS — R0600 Dyspnea, unspecified: Secondary | ICD-10-CM | POA: Insufficient documentation

## 2012-04-16 NOTE — Assessment & Plan Note (Signed)
Cholesterol is at goal.  Continue current dose of statin and diet Rx.  No myalgias or side effects.  F/U  LFT's in 6 months. Lab Results  Component Value Date   LDLCALC 147* 12/04/2011   Continue diet Rx per priamry

## 2012-04-16 NOTE — Assessment & Plan Note (Signed)
Consider non stimulant drug like Concerta  F/U with therapist

## 2012-04-16 NOTE — Assessment & Plan Note (Signed)
Normal exam and ECG Functional level seems high.  F/U cardiopulmonary stress test
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
## 2012-04-16 NOTE — Patient Instructions (Signed)
Your physician recommends that you schedule a follow-up appointment in: AS NEEDED  Your physician recommends that you continue on your current medications as directed. Please refer to the Current Medication list given to you today.  Your physician has recommended that you have a cardiopulmonary stress test (CPX). CPX testing is a non-invasive measurement of heart and lung function. It replaces a traditional treadmill stress test. This type of test provides a tremendous amount of information that relates not only to your present condition but also for future outcomes. This test combines measurements of you ventilation, respiratory gas exchange in the lungs, electrocardiogram (EKG), blood pressure and physical response before, during, and following an exercise protocol.  

## 2012-04-22 ENCOUNTER — Encounter (HOSPITAL_COMMUNITY): Payer: Self-pay

## 2012-04-24 ENCOUNTER — Ambulatory Visit (HOSPITAL_COMMUNITY): Payer: BC Managed Care – PPO | Attending: Cardiovascular Disease

## 2012-04-24 DIAGNOSIS — R0602 Shortness of breath: Secondary | ICD-10-CM | POA: Insufficient documentation

## 2012-04-29 ENCOUNTER — Other Ambulatory Visit: Payer: Self-pay | Admitting: *Deleted

## 2012-04-29 DIAGNOSIS — R0600 Dyspnea, unspecified: Secondary | ICD-10-CM

## 2012-05-02 HISTORY — PX: RHINOPLASTY: SUR1284

## 2012-05-15 ENCOUNTER — Other Ambulatory Visit: Payer: Self-pay | Admitting: *Deleted

## 2012-05-15 ENCOUNTER — Telehealth: Payer: Self-pay | Admitting: Cardiovascular Disease

## 2012-05-15 DIAGNOSIS — R0600 Dyspnea, unspecified: Secondary | ICD-10-CM

## 2012-05-15 NOTE — Telephone Encounter (Signed)
New problem   Pt was awaiting your phone call for a pulmonary specialist. Please call pt.

## 2012-05-15 NOTE — Telephone Encounter (Signed)
REFERRAL ORDER ENTERED PT NEEDS PFT 'S  AND PULMONARY  SEE ORDERS  LEFT VOICE MAIL FOR PT  WILL FORWARD MESSAGE  TO   SCHEDULING TO SET UP  PFT AND PULMONARY  APPT .Zack Seal

## 2012-05-22 NOTE — Telephone Encounter (Signed)
F/u    Pt calling about referral because he hasn't heard back from anyone

## 2012-05-24 NOTE — Telephone Encounter (Signed)
PFT  SCHEDULED FOR  6/1//6/ AND APPT WITH DR CLANCE  IS SET UP  AT 06-28-12 WILL TRY  TO CALL PT NEXT WEEK TO MAKE SURE IS AWARE OF APPTS./CY

## 2012-06-03 NOTE — Telephone Encounter (Signed)
LMTCB .  6.2.14/CY

## 2012-06-17 ENCOUNTER — Ambulatory Visit (INDEPENDENT_AMBULATORY_CARE_PROVIDER_SITE_OTHER): Payer: BC Managed Care – PPO | Admitting: Internal Medicine

## 2012-06-17 DIAGNOSIS — R0609 Other forms of dyspnea: Secondary | ICD-10-CM

## 2012-06-17 DIAGNOSIS — R0989 Other specified symptoms and signs involving the circulatory and respiratory systems: Secondary | ICD-10-CM

## 2012-06-17 DIAGNOSIS — R0600 Dyspnea, unspecified: Secondary | ICD-10-CM

## 2012-06-17 LAB — PULMONARY FUNCTION TEST

## 2012-06-17 NOTE — Progress Notes (Signed)
PFT done today. 

## 2012-06-20 NOTE — Telephone Encounter (Signed)
PT HAD PFT'S DONE  HAS F/U END OF MONTH WITH DR CLANCE./CY

## 2012-06-28 ENCOUNTER — Encounter: Payer: Self-pay | Admitting: Pulmonary Disease

## 2012-06-28 ENCOUNTER — Ambulatory Visit (INDEPENDENT_AMBULATORY_CARE_PROVIDER_SITE_OTHER)
Admission: RE | Admit: 2012-06-28 | Discharge: 2012-06-28 | Disposition: A | Discharge status: Home / Self Care | Payer: BC Managed Care – PPO | Source: Ambulatory Visit | Attending: Pulmonary Disease | Admitting: Pulmonary Disease

## 2012-06-28 ENCOUNTER — Ambulatory Visit (INDEPENDENT_AMBULATORY_CARE_PROVIDER_SITE_OTHER): Payer: BC Managed Care – PPO | Admitting: Pulmonary Disease

## 2012-06-28 VITALS — BP 122/84 | HR 86 | Temp 98.4°F | Ht 75.0 in | Wt 210.8 lb

## 2012-06-28 DIAGNOSIS — R0600 Dyspnea, unspecified: Secondary | ICD-10-CM

## 2012-06-28 DIAGNOSIS — R0609 Other forms of dyspnea: Secondary | ICD-10-CM

## 2012-06-28 NOTE — Assessment & Plan Note (Addendum)
The patient gives an unusual history of a sensation of dyspnea the day after heavy exertional activity.  This occurs at rest, but he does not have any issues with mild to moderate exertion the same day.  He does describe air trapping with a fair amount of accuracy, and although his actual flows are not that abnormal, he did have air trapping on his pulmonary function studies.  It is unclear if these subtle findings had anything to do with asthma or his abnormal breathing sensation.  However, because he feels it negatively impacts his quality of life is such a high degree, I am willing to give him a trial of treatment for asthma.  Since this is both a therapeutic and diagnostic trial, I would like to treat him aggressively with a LABA/ICS.  The patient is agreeable to this approach.

## 2012-06-28 NOTE — Patient Instructions (Addendum)
Will check a cxr today, and call you with results. Will try dulera 100/5  2 inhalations am AND pm everyday no matter what for next 3 weeks.  Rinse your mouth out and gargle after each use. Please call me in 3 weeks to give update on how things are going.

## 2012-06-28 NOTE — Progress Notes (Signed)
  Subjective:    Patient ID: Victor Patterson, male    DOB: 20-Mar-1970, 42 y.o.   MRN: [id number]  HPI The patient is a 42 year old male who been asked to see for an abnormal sensation of breathing.  He has had issues with the last 6-8 months with a feeling of dyspnea the day after heavy exertional activities.  He feels the severity has been stable.  He is a very active person with running and cycling, and states these events occur the day after a heavy exercise day.  They always occur at rest, and he feels that he has issues with exhalation.  He is describing what sounds like air trapping, and then it can lead to issues with inhalation as well.  What is unusual is that he came to mild to moderate exertional activity with no breathing issues on the same day this breathing problem can last one day or multiple days.  The patient has absolutely no cough, and no personal history or family history of asthma.  He has had full pulmonary function studies that showed no airflow obstruction by FEV1 percent, no restriction, and a normal diffusion capacity.  He was noted to have subtle air trapping on his lung volumes.  He has also had a cardiopulmonary exercise study that showed a normal FEV1 percent, although a mildly decreased FEV1 pre-exercise.  There was no evidence for exercise-induced asthma post exercise.   Review of Systems  Constitutional: Negative for fever and unexpected weight change.  HENT: Negative for ear pain, nosebleeds, congestion, sore throat, rhinorrhea, sneezing, trouble swallowing, dental problem, postnasal drip and sinus pressure.   Eyes: Negative for redness and itching.  Respiratory: Negative for cough, chest tightness, shortness of breath and wheezing.   Cardiovascular: Negative for palpitations and leg swelling.  Gastrointestinal: Negative for nausea and vomiting.  Genitourinary: Negative for dysuria.  Musculoskeletal: Negative for joint swelling.  Skin: Negative for rash.   Neurological: Negative for headaches.  Hematological: Does not bruise/bleed easily.  Psychiatric/Behavioral: Negative for dysphoric mood. The patient is not nervous/anxious.        Objective:   Physical Exam Constitutional:  Well developed, no acute distress  HENT:  Nares patent without discharge  Oropharynx without exudate, palate and uvula are normal  Eyes:  Perrla, eomi, no scleral icterus  Neck:  No JVD, no TMG  Cardiovascular:  Normal rate, regular rhythm, no rubs or gallops.  No murmurs        Intact distal pulses  Pulmonary :  Normal breath sounds, no stridor or respiratory distress   No rales, rhonchi, or wheezing  Abdominal:  Soft, nondistended, bowel sounds present.  No tenderness noted.   Musculoskeletal:  No lower extremity edema noted.  Lymph Nodes:  No cervical lymphadenopathy noted  Skin:  No cyanosis noted  Neurologic:  Alert, appropriate, moves all 4 extremities without obvious deficit.         Assessment & Plan:

## 2012-07-16 ENCOUNTER — Telehealth: Payer: Self-pay | Admitting: Pulmonary Disease

## 2012-07-16 NOTE — Telephone Encounter (Signed)
LMOM x 1  CXR results

## 2012-07-17 NOTE — Telephone Encounter (Signed)
Notes Recorded by Barbaraann Share, MD on 07/01/2012 at 5:51 PM Please let pt know that his cxr is clear.  -----  Called, spoke with pt.  Informed him of cxr results per Dr. Shelle Iron.  He verbalized understanding and voiced no further questions or concerns at this time.

## 2012-08-16 ENCOUNTER — Other Ambulatory Visit: Payer: Self-pay | Admitting: Physician Assistant

## 2012-09-03 ENCOUNTER — Encounter: Payer: Self-pay | Admitting: Family

## 2012-09-03 ENCOUNTER — Ambulatory Visit: Payer: BC Managed Care – PPO | Admitting: Family

## 2012-09-03 ENCOUNTER — Ambulatory Visit (INDEPENDENT_AMBULATORY_CARE_PROVIDER_SITE_OTHER): Payer: BC Managed Care – PPO | Admitting: Family

## 2012-09-03 VITALS — BP 120/70 | HR 89 | Temp 98.0°F | Wt 210.0 lb

## 2012-09-03 DIAGNOSIS — L255 Unspecified contact dermatitis due to plants, except food: Secondary | ICD-10-CM

## 2012-09-03 DIAGNOSIS — L237 Allergic contact dermatitis due to plants, except food: Secondary | ICD-10-CM

## 2012-09-03 MED ORDER — PREDNISONE 10 MG PO TABS: ORAL_TABLET | ORAL | Status: DC

## 2012-09-03 MED ORDER — METHYLPREDNISOLONE SODIUM SUCC 125 MG IJ SOLR
125.0000 mg | Freq: Once | INTRAMUSCULAR | Status: AC
  Administered 2012-09-03: 125 mg via INTRAMUSCULAR

## 2012-09-03 NOTE — Progress Notes (Signed)
  Subjective:    Patient ID: Victor Patterson, male    DOB: 10-08-1970, 42 y.o.   MRN: [id number]  HPI   Victor Patterson is a 42 yr old male who presents today with chief complaint of itchy rash.  Reports that he did yard work on Saturday. Developed rash on arm/legs on Saturday.  Reports that he has been using an otc spray and cortisone 10 with little improvement in his symptoms. Reports some trouble sleeping due to the itching.    Review of Systems See HPI    Objective:   Physical Exam  Constitutional: He is oriented to person, place, and time. He appears well-developed and well-nourished. No distress.  HENT:  Head: Normocephalic and atraumatic.  Neurological: He is alert and oriented to person, place, and time.  Skin:  + raised erythematous rash noted on left forearm.   Psychiatric: He has a normal mood and affect. His behavior is normal. Judgment and thought content normal.          Assessment & Plan:

## 2012-09-03 NOTE — Assessment & Plan Note (Signed)
Recommended zyrtec during the day and benadryl HS for itching and discomfort.  IM solumedrol today to be followed by a prednisone taper.

## 2012-09-03 NOTE — Patient Instructions (Addendum)
Start prednisone taper this afternoon. You may use zyrtec 10 mg once daily during the day for itching and benadryl 25mg  1-2 tablets one hour prior to bed to help with itching and sleep.  Poison [employer] ivy is a inflammation of the skin (contact dermatitis) caused by touching the allergens on the leaves of the ivy plant following previous exposure to the plant. The rash usually appears 48 hours after exposure. The rash is usually bumps (papules) or blisters (vesicles) in a linear pattern. Depending on your own sensitivity, the rash may simply cause redness and itching, or it may also progress to blisters which may break open. These must be well cared for to prevent secondary bacterial (germ) infection, followed by scarring. Keep any open areas dry, clean, dressed, and covered with an antibacterial ointment if needed. The eyes may also get puffy. The puffiness is worst in the morning and gets better as the day progresses. This dermatitis usually heals without scarring, within 2 to 3 weeks without treatment. HOME CARE INSTRUCTIONS  Thoroughly wash with soap and water as soon as you have been exposed to poison ivy. You have about one half hour to remove the plant resin before it will cause the rash. This washing will destroy the oil or antigen on the skin that is causing, or will cause, the rash. Be sure to wash under your fingernails as any plant resin there will continue to spread the rash. Do not rub skin vigorously when washing affected area. Poison ivy cannot spread if no oil from the plant remains on your body. A rash that has progressed to weeping sores will not spread the rash unless you have not washed thoroughly. It is also important to wash any clothes you have been wearing as these may carry active allergens. The rash will return if you wear the unwashed clothing, even several days later. Avoidance of the plant in the future is the best measure. Poison ivy plant can be recognized by the number of  leaves. Generally, poison ivy has three leaves with flowering branches on a single stem. Diphenhydramine may be purchased over the counter and used as needed for itching. Do not drive with this medication if it makes you drowsy.Ask your caregiver about medication for children. SEEK MEDICAL CARE IF:  Open sores develop.  Redness spreads beyond area of rash.  You notice purulent (pus-like) discharge.  You have increased pain.  Other signs of infection develop (such as fever). Document Released: 12/17/1999 Document Revised: 03/13/2011 Document Reviewed: 11/04/2008 [facility] Patient Information 2014 [place], [place].

## 2012-09-03 NOTE — Addendum Note (Signed)
Addended by: Verdie Shire on: 09/03/2012 09:58 AM   Modules accepted: Orders

## 2013-02-04 ENCOUNTER — Other Ambulatory Visit: Payer: Self-pay | Admitting: Physician Assistant

## 2013-06-05 ENCOUNTER — Other Ambulatory Visit: Payer: Self-pay | Admitting: Physician Assistant

## 2013-07-29 ENCOUNTER — Encounter: Payer: Self-pay | Admitting: Family

## 2013-07-29 ENCOUNTER — Ambulatory Visit (INDEPENDENT_AMBULATORY_CARE_PROVIDER_SITE_OTHER): Payer: BC Managed Care – PPO | Admitting: Family

## 2013-07-29 VITALS — BP 128/90 | HR 88 | Temp 98.2°F | Resp 16 | Ht 76.0 in | Wt 218.1 lb

## 2013-07-29 DIAGNOSIS — K625 Hemorrhage of anus and rectum: Secondary | ICD-10-CM

## 2013-07-29 DIAGNOSIS — F988 Other specified behavioral and emotional disorders with onset usually occurring in childhood and adolescence: Secondary | ICD-10-CM

## 2013-07-29 DIAGNOSIS — R3915 Urgency of urination: Secondary | ICD-10-CM

## 2013-07-29 DIAGNOSIS — L290 Pruritus ani: Secondary | ICD-10-CM

## 2013-07-29 MED ORDER — HYDROCORTISONE ACE-PRAMOXINE 1-1 % RE FOAM: 1.0000 | Freq: Two times a day (BID) | RECTAL | Status: DC

## 2013-07-29 NOTE — Assessment & Plan Note (Addendum)
Patient with hx of hemorrhoid c/o pruritus anus for 1 week. Noticed occasional few drops of blood after defecation 1-2 times in past 6 months. Check Stool hemoccult given to patient to mail in to office. Pt given Rx for proctofoam use as prescribed. Recommend PrepH wipes for cleansing.

## 2013-07-29 NOTE — Progress Notes (Signed)
Subjective:    Patient ID: Victor Patterson, male    DOB: 07/21/1970, 43 y.o.   MRN: [id number]  HPI Patients seen for complaint pruritus of anus for 1 week.  Hx of hemorrhoids. Patient reports having 1-2 drops of bright red blood 1-2 times every 6 months which he relates to hard stools.   Patient reports concerns of urinary urgency has  Had stress incontinence 2 times in past 1-2 years.  Urinates times 1 during the night. ADD Patient reports he is feeling well on Adderall.XR.     Review of Systems  Constitutional: Negative.   Respiratory: Negative.   Cardiovascular: Negative.   Gastrointestinal: Negative for abdominal pain, diarrhea, constipation, blood in stool and anal bleeding.       Patient reports prurtius of anus for 1 week. Denies any constipation, or recent hemorrhoids. Denies abdominal bloating, distention, pain or cramping. Denies pain with defecation.  Genitourinary: Positive for urgency. Negative for dysuria, frequency and flank pain.  Skin: Negative.   Psychiatric/Behavioral: Negative.    Past Medical History  Diagnosis Date  . ADD (attention deficit disorder)     see psych  . Depression     see psych  . Hyperlipidemia     not on meds  . Deviated septum   . Eustachian tube dysfunction     right  . Proctalgia fugax   . Herniated disc     lower back -has seen ortho in the past and had steroid shots 2005  . Elevated blood-pressure reading without diagnosis of hypertension     resolved  . Liver hemangioma 06/2009  . Rib fracture 06/2009    8th and 9th  . 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 Marital Status: Married    Spouse Name: N/A    Number of Children: N/A  . Years of Education: N/A   Occupational History  . Not on file.   Social History Main Topics  . Smoking status: Never Smoker   . Smokeless tobacco: Never Used  . Alcohol Use: 1.2 oz/week    2 Cans of beer per week     Comment: 3-4 week  . Drug  Use: No  . Sexual Activity: Not on file   Other Topics Concern  . Not on file   Social History Narrative   "[name]"   Married, no children  (wife [name], nephew [name])   Occupation: [employer], road side assistance for  trucks    Patient has never smoked.    Daily Caffeine Use 2/day      Illicit Drug Use - no   Alcohol Use - yes (social)   no exercise   diet-- candy a lot otherwise diet is average     Past Surgical History  Procedure Laterality Date  . Colonoscopy  2006 and 05/2010    hemorrhoids  . Rhinoplasty  05/2012    Family History  Problem Relation Age of Onset  . Diabetes Maternal Uncle   . Coronary artery disease      grandgfather-late in life  . Colon cancer      grandmother in her 43's  . Other      no family history of liver disease  . Anxiety disorder Father   . Colon cancer Maternal Grandfather   . Anxiety disorder Mother     No Known Allergies  No current outpatient prescriptions on file prior to visit.   No current facility-administered medications on  file prior to visit.    BP 128/90  Pulse 88  Temp(Src) 98.2 F (36.8 C) (Oral)  Resp 16  Ht 6\' 4"  (1.93 m)  Wt 218 lb 1.9 oz (98.939 kg)  BMI 26.56 kg/m2  SpO2 99%       Objective:   Physical Exam  Constitutional: He is oriented to person, place, and time. He appears well-developed and well-nourished. No distress.  HENT:  Head: Normocephalic.  Genitourinary: Prostate normal.  Rectal exam negative for hemorrhoid, fissure. Prostate within normal limits per Debbrah Alar NP exam.    Neurological: He is alert and oriented to person, place, and time.  Skin: Skin is warm and dry.  Psychiatric: He has a normal mood and affect. His behavior is normal.          Assessment & Plan:  Patient seen along with Steva Colder NP, who is currently in orientation for training purposes. I have personally examined pt and agree with assessment and plan.

## 2013-07-29 NOTE — Patient Instructions (Signed)
Patient to get labs checked PSA Take home hemoccult card and return to office Use Protofoam as prescribed Check urine for micro and culture.  Follow up with Debbrah Alar NP  Call clinic with concerns or if symptoms not improved

## 2013-07-29 NOTE — Assessment & Plan Note (Signed)
Patient doing well on Adderall. Tolerating well. Continue current medciation

## 2013-07-29 NOTE — Assessment & Plan Note (Signed)
Patient reports having urinary urgency and has experienced stress incontinence 2 times during past 2 years.  Patient reports having to urinate 1 time during night.  Check UA micro and culture and sensitivity.  Check PSA

## 2013-07-29 NOTE — Progress Notes (Signed)
Pre visit review using our clinic review tool, if applicable. No additional management support is needed unless otherwise documented below in the visit note. 

## 2013-07-30 LAB — URINALYSIS, ROUTINE W REFLEX MICROSCOPIC
BILIRUBIN URINE: NEGATIVE
GLUCOSE, UA: NEGATIVE mg/dL
HGB URINE DIPSTICK: NEGATIVE
Ketones, ur: NEGATIVE mg/dL
Leukocytes, UA: NEGATIVE
Nitrite: NEGATIVE
Protein, ur: NEGATIVE mg/dL
Specific Gravity, Urine: 1.026 (ref 1.005–1.030)
Urobilinogen, UA: 0.2 mg/dL (ref 0.0–1.0)
pH: 5.5 (ref 5.0–8.0)

## 2013-07-30 LAB — PSA, TOTAL AND FREE
PSA, Free Pct: 34 % (ref >25)
PSA, Free: 0.2 ng/mL
PSA: 0.59 ng/mL (ref <=4.00)

## 2013-07-30 LAB — URINE CULTURE
Colony Count: NO GROWTH
Organism ID, Bacteria: NO GROWTH

## 2013-07-30 LAB — URINALYSIS, MICROSCOPIC ONLY
Bacteria, UA: NONE SEEN
CRYSTALS: NONE SEEN
Casts: NONE SEEN
SQUAMOUS EPITHELIAL / LPF: NONE SEEN

## 2013-08-04 ENCOUNTER — Ambulatory Visit: Payer: BC Managed Care – PPO | Admitting: Family

## 2013-12-10 ENCOUNTER — Ambulatory Visit (INDEPENDENT_AMBULATORY_CARE_PROVIDER_SITE_OTHER): Payer: BC Managed Care – PPO | Admitting: Medical

## 2013-12-10 ENCOUNTER — Encounter: Payer: Self-pay | Admitting: Medical

## 2013-12-10 VITALS — BP 142/87 | HR 101 | Temp 99.5°F | Ht 75.5 in | Wt 199.6 lb

## 2013-12-10 DIAGNOSIS — J209 Acute bronchitis, unspecified: Secondary | ICD-10-CM | POA: Insufficient documentation

## 2013-12-10 MED ORDER — FLUTICASONE PROPIONATE 50 MCG/ACT NA SUSP: 2.0000 | Freq: Every day | NASAL | Status: DC

## 2013-12-10 MED ORDER — BENZONATATE 100 MG PO CAPS: 100.0000 mg | ORAL_CAPSULE | Freq: Three times a day (TID) | ORAL | Status: DC | PRN

## 2013-12-10 MED ORDER — AZITHROMYCIN 250 MG PO TABS: ORAL_TABLET | ORAL | Status: DC

## 2013-12-10 NOTE — Progress Notes (Signed)
Pre visit review using our clinic review tool, if applicable. No additional management support is needed unless otherwise documented below in the visit note. 

## 2013-12-10 NOTE — Assessment & Plan Note (Signed)
You appear to have bronchitis with quick onset.  Rest hydrate and tylenol for fever. I am prescribing benzonatate  cough medicine, and azithromcyin antibiotic. For your nasal congestion I am prescribing flonase.   You should gradually get better. If not then notify us and would recommend a chest xray.  Not also with pt presentation of low grade fever, chills and overall appearance consideration was given for walking pneumonia so decided to go ahead and start antibiotic.

## 2013-12-10 NOTE — Patient Instructions (Addendum)
You appear to have bronchitis with quick onset.  Rest hydrate and tylenol for fever. I am prescribing benzonatate  cough medicine, and azithromcyin antibiotic. For your nasal congestion I am prescribing flonase.   You should gradually get better. If not then notify us and would recommend a chest xray.  Follow up in 7-10 days or as needed  Note pt counseled and educated on costochondritis. Explained [place] ibuprofen otc or alleve otc.

## 2013-12-10 NOTE — Progress Notes (Signed)
Subjective:    Patient ID: Victor Patterson, male    DOB: 1970/05/17, 43 y.o.   MRN: [id number]  HPI     Pt in today reporting cough, nasal congestion chest congestion  and runny nose for 2 Days. Some sore throat in the am only.  Pt does not report any major sinus pressure.  Pt had dry cough and some very sharp, transient 1 second chest pain when he coughs.  Associated symptoms( below yes or no)  Fever-yes Chills-Yes.Little  Chest congestion-yes Sneezing- yes Itching eyes-no Sore throat- mild Post-nasal drainage-ys Wheezing-no Purulent drainage-no Fatigue-yes Mild body aches this am.    Past Medical History  Diagnosis Date  . ADD (attention deficit disorder)     see psych  . Depression     see psych  . Hyperlipidemia     not on meds  . Deviated septum   . Eustachian tube dysfunction     right  . Proctalgia fugax   . Herniated disc     lower back -has seen ortho in the past and had steroid shots 2005  . Elevated blood-pressure reading without diagnosis of hypertension     resolved  . Liver hemangioma 06/2009  . Rib fracture 06/2009    8th and 9th  . 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 Marital Status: Married    Spouse Name: N/A    Number of Children: N/A  . Years of Education: N/A   Occupational History  . Not on file.   Social History Main Topics  . Smoking status: Never Smoker   . Smokeless tobacco: Never Used  . Alcohol Use: 1.2 oz/week    2 Cans of beer per week     Comment: 3-4 week  . Drug Use: No  . Sexual Activity: Not on file   Other Topics Concern  . Not on file   Social History Narrative   "[name]"   Married, no children  (wife [name], nephew [name])   Occupation: [employer], road side assistance for  trucks    Patient has never smoked.    Daily Caffeine Use 2/day      Illicit Drug Use - no   Alcohol Use - yes (social)   no exercise   diet-- candy a lot otherwise diet is average      Past Surgical History  Procedure Laterality Date  . Colonoscopy  2006 and 05/2010    hemorrhoids  . Rhinoplasty  05/2012    Family History  Problem Relation Age of Onset  . Diabetes Maternal Uncle   . Coronary artery disease      grandgfather-late in life  . Colon cancer      grandmother in her 29's  . Other      no family history of liver disease  . Anxiety disorder Father   . Colon cancer Maternal Grandfather   . Anxiety disorder Mother     No Known Allergies  Current Outpatient Prescriptions on File Prior to Visit  Medication Sig Dispense Refill  . amphetamine-dextroamphetamine (ADDERALL) 7.5 MG tablet Take 7.5 mg by mouth 2 (two) times daily.    Marland Kitchen buPROPion (WELLBUTRIN XL) 150 MG 24 hr tablet Take 150 mg by mouth daily.    . hydrocortisone-pramoxine (PROCTOFOAM HC) rectal foam Place 1 applicator rectally 2 (two) times daily. (Patient not taking: Reported on 12/10/2013) 10 g 0   No current facility-administered medications on file prior to visit.  BP 142/87 mmHg  Pulse 101  Temp(Src) 99.5 F (37.5 C) (Oral)  Ht 6' 3.5" (1.918 m)  Wt 199 lb 9.6 oz (90.538 kg)  BMI 24.61 kg/m2  SpO2 100%         Review of Systems  Constitutional: Positive for fever, chills and fatigue.  HENT: Positive for congestion, postnasal drip, rhinorrhea, sinus pressure, sneezing and sore throat. Negative for ear pain.   Respiratory: Positive for cough. Negative for chest tightness and wheezing.   Cardiovascular: Negative for chest pain and palpitations.  Endocrine: Positive for cold intolerance.  Musculoskeletal: Negative for neck pain.  Neurological: Negative for dizziness and headaches.  Hematological: Negative for adenopathy. Does not bruise/bleed easily.       Objective:   Physical Exam   General  Mental Status - Alert. General Appearance - Well groomed. Not in acute distress. Moderate nasal congestion  Skin Rashes- No Rashes.  HEENT Head- Normal. Ear Auditory  Canal - Left- Normal. Right - Normal.Tympanic Membrane- Left- Normal. Right- Normal. Eye Sclera/Conjunctiva- Left- Normal. Right- Normal. Nose & Sinuses Nasal Mucosa- Left-  Mlid boggy + Congested. Right- Mild   boggy + Congested. Mouth & Throat Lips: Upper Lip- Normal: no dryness, cracking, pallor, cyanosis, or vesicular eruption. Lower Lip-Normal: no dryness, cracking, pallor, cyanosis or vesicular eruption. Buccal Mucosa- Bilateral- No Aphthous ulcers. Oropharynx- No Discharge or Erythema. Tonsils: Characteristics- Bilateral- No Erythema or Congestion. Size/Enlargement- Bilateral- No enlargement. Discharge- bilateral-None.  Neck Neck- Supple. No Masses.   Chest and Lung Exam Auscultation: Breath Sounds:- even and unlabored, but faint bilateral upper lobe rhonchi.  Cardiovascular Auscultation:Rythm- Regular, rate and rhythm. Murmurs & Other Heart Sounds:Ausculatation of the heart reveal- No Murmurs.  Lymphatic Head & Neck General Head & Neck Lymphatics: Bilateral: Description- No Localized lymphadenopathy.         Assessment & Plan:

## 2014-06-25 ENCOUNTER — Encounter: Payer: Self-pay | Admitting: Internal Medicine

## 2014-11-13 ENCOUNTER — Encounter (INDEPENDENT_AMBULATORY_CARE_PROVIDER_SITE_OTHER): Payer: Self-pay

## 2014-11-13 ENCOUNTER — Other Ambulatory Visit (HOSPITAL_COMMUNITY)
Admission: RE | Admit: 2014-11-13 | Discharge: 2014-11-13 | Disposition: A | Discharge status: Home / Self Care | Payer: BLUE CROSS/BLUE SHIELD | Source: Ambulatory Visit | Attending: Medical | Admitting: Medical

## 2014-11-13 ENCOUNTER — Ambulatory Visit (INDEPENDENT_AMBULATORY_CARE_PROVIDER_SITE_OTHER): Payer: BLUE CROSS/BLUE SHIELD | Admitting: Medical

## 2014-11-13 ENCOUNTER — Encounter: Payer: Self-pay | Admitting: Medical

## 2014-11-13 VITALS — BP 124/82 | HR 88 | Temp 98.1°F | Ht 75.5 in | Wt 206.0 lb

## 2014-11-13 DIAGNOSIS — Z113 Encounter for screening for infections with a predominantly sexual mode of transmission: Secondary | ICD-10-CM | POA: Insufficient documentation

## 2014-11-13 DIAGNOSIS — M2480 Other specific joint derangements of unspecified joint, not elsewhere classified: Secondary | ICD-10-CM

## 2014-11-13 DIAGNOSIS — Z202 Contact with and (suspected) exposure to infections with a predominantly sexual mode of transmission: Secondary | ICD-10-CM | POA: Diagnosis not present

## 2014-11-13 DIAGNOSIS — N342 Other urethritis: Secondary | ICD-10-CM | POA: Diagnosis not present

## 2014-11-13 DIAGNOSIS — M25561 Pain in right knee: Secondary | ICD-10-CM | POA: Diagnosis not present

## 2014-11-13 LAB — URIC ACID: URIC ACID, SERUM: 7.2 mg/dL (ref 4.0–7.8)

## 2014-11-13 LAB — RPR

## 2014-11-13 MED ORDER — HYDROCODONE-ACETAMINOPHEN 5-325 MG PO TABS: 1.0000 | ORAL_TABLET | Freq: Four times a day (QID) | ORAL | Status: DC | PRN

## 2014-11-13 MED ORDER — DICLOFENAC SODIUM 75 MG PO TBEC: 75.0000 mg | DELAYED_RELEASE_TABLET | Freq: Two times a day (BID) | ORAL | Status: DC

## 2014-11-13 MED ORDER — CEFTRIAXONE SODIUM 1 G IJ SOLR
1.0000 g | Freq: Once | INTRAMUSCULAR | Status: AC
  Administered 2014-11-13: 1 g via INTRAMUSCULAR

## 2014-11-13 MED ORDER — DOXYCYCLINE HYCLATE 100 MG PO TABS: 100.0000 mg | ORAL_TABLET | Freq: Two times a day (BID) | ORAL | Status: DC

## 2014-11-13 NOTE — Progress Notes (Signed)
Pre visit review using our clinic review tool, if applicable. No additional management support is needed unless otherwise documented below in the visit note. 

## 2014-11-13 NOTE — Patient Instructions (Addendum)
Will get cspine xray. Assess joint spaces.  Get xray of rt knee, cbc, and uric acid. Rest, ice to knee twice daily and elevate. If knee has acute swelling with fever and chills you may need evaluation of knee fluid and IM antibiotic. rx diclofenac for pain. norco if pain worsens.  For your dysuria will rx doxycycline. But also get urine ancillary studies.  Will get hiv and rpr test today.  Follow up in 7 days or as needed.
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
## 2014-11-14 LAB — HIV ANTIBODY (ROUTINE TESTING W REFLEX): HIV: NONREACTIVE

## 2014-11-17 LAB — URINE CYTOLOGY ANCILLARY ONLY
CHLAMYDIA, DNA PROBE: NEGATIVE
NEISSERIA GONORRHEA: NEGATIVE
TRICH (WINDOWPATH): NEGATIVE

## 2014-11-18 LAB — URINE CYTOLOGY ANCILLARY ONLY
BACTERIAL VAGINITIS: NEGATIVE
Candida vaginitis: NEGATIVE

## 2014-11-20 ENCOUNTER — Ambulatory Visit (INDEPENDENT_AMBULATORY_CARE_PROVIDER_SITE_OTHER): Payer: BLUE CROSS/BLUE SHIELD | Admitting: Family Medicine

## 2014-11-20 ENCOUNTER — Other Ambulatory Visit: Payer: Self-pay | Admitting: Medical

## 2014-11-20 ENCOUNTER — Ambulatory Visit (HOSPITAL_BASED_OUTPATIENT_CLINIC_OR_DEPARTMENT_OTHER)
Admission: RE | Admit: 2014-11-20 | Discharge: 2014-11-20 | Disposition: A | Discharge status: Home / Self Care | Payer: BLUE CROSS/BLUE SHIELD | Source: Ambulatory Visit | Attending: Medical | Admitting: Medical

## 2014-11-20 ENCOUNTER — Encounter: Payer: Self-pay | Admitting: Family Medicine

## 2014-11-20 VITALS — BP 104/68 | HR 75 | Temp 98.1°F | Wt 206.0 lb

## 2014-11-20 DIAGNOSIS — M25561 Pain in right knee: Secondary | ICD-10-CM

## 2014-11-20 DIAGNOSIS — L02419 Cutaneous abscess of limb, unspecified: Secondary | ICD-10-CM | POA: Diagnosis not present

## 2014-11-20 DIAGNOSIS — L03115 Cellulitis of right lower limb: Secondary | ICD-10-CM | POA: Diagnosis not present

## 2014-11-20 DIAGNOSIS — L03119 Cellulitis of unspecified part of limb: Secondary | ICD-10-CM | POA: Diagnosis not present

## 2014-11-20 DIAGNOSIS — M542 Cervicalgia: Secondary | ICD-10-CM | POA: Diagnosis present

## 2014-11-20 DIAGNOSIS — M2480 Other specific joint derangements of unspecified joint, not elsewhere classified: Secondary | ICD-10-CM

## 2014-11-20 MED ORDER — CEPHALEXIN 500 MG PO CAPS: 500.0000 mg | ORAL_CAPSULE | Freq: Two times a day (BID) | ORAL | Status: DC

## 2014-11-20 NOTE — Progress Notes (Signed)
Pre visit review using our clinic review tool, if applicable. No additional management support is needed unless otherwise documented below in the visit note. 

## 2014-11-20 NOTE — Patient Instructions (Signed)

## 2014-11-21 DIAGNOSIS — L0390 Cellulitis, unspecified: Secondary | ICD-10-CM | POA: Insufficient documentation

## 2014-11-21 LAB — COMPREHENSIVE METABOLIC PANEL
ALK PHOS: 103 U/L (ref 40–115)
ALT: 15 U/L (ref 9–46)
AST: 18 U/L (ref 10–40)
Albumin: 4.2 g/dL (ref 3.6–5.1)
BUN: 15 mg/dL (ref 7–25)
CO2: 27 mmol/L (ref 20–31)
Calcium: 9.7 mg/dL (ref 8.6–10.3)
Chloride: 101 mmol/L (ref 98–110)
Creat: 0.82 mg/dL (ref 0.60–1.35)
GLUCOSE: 77 mg/dL (ref 65–99)
POTASSIUM: 4.4 mmol/L (ref 3.5–5.3)
Sodium: 140 mmol/L (ref 135–146)
Total Bilirubin: 1.1 mg/dL (ref 0.2–1.2)
Total Protein: 7.4 g/dL (ref 6.1–8.1)

## 2014-11-21 LAB — URIC ACID: URIC ACID, SERUM: 7.1 mg/dL (ref 4.0–7.8)

## 2014-11-21 NOTE — Progress Notes (Signed)
Patient ID: Victor Patterson, male    DOB: December 26, 1970  Age: 44 y.o. MRN: [id number]    Subjective:  Subjective HPI Victor Patterson presents for r knee pain.  It started after running.  It improved but then he ran again and it started to bother him.  It is still hot but not as bad as previous.     Review of Systems  Constitutional: Negative for diaphoresis, appetite change, fatigue and unexpected weight change.  Eyes: Negative for pain, redness and visual disturbance.  Respiratory: Negative for cough, chest tightness, shortness of breath and wheezing.   Cardiovascular: Negative for chest pain, palpitations and leg swelling.  Endocrine: Negative for cold intolerance, heat intolerance, polydipsia, polyphagia and polyuria.  Genitourinary: Negative for dysuria, frequency and difficulty urinating.  Musculoskeletal: Positive for joint swelling.       R knee pain/ swelling  Neurological: Negative for dizziness, light-headedness, numbness and headaches.    History Past Medical History  Diagnosis Date  . ADD (attention deficit disorder)     see psych  . Depression     see psych  . Hyperlipidemia     not on meds  . Deviated septum   . Eustachian tube dysfunction     right  . Proctalgia fugax   . Herniated disc     lower back -has seen ortho in the past and had steroid shots 2005  . Elevated blood-pressure reading without diagnosis of hypertension     resolved  . Liver hemangioma 06/2009  . Rib fracture 06/2009    8th and 9th  . 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   His family history includes Anxiety disorder in his father and mother; Colon cancer in his maternal grandfather and another family member; Coronary artery disease in an other family member; Diabetes in his maternal uncle; Other in an other family member.He reports that he has never smoked. He has never used  smokeless tobacco. He reports that he drinks about 1.2 oz of alcohol per week. He reports that he does not use illicit drugs.  Current Outpatient Prescriptions on File Prior to Visit  Medication Sig Dispense Refill  . amphetamine-dextroamphetamine (ADDERALL) 7.5 MG tablet Take 7.5 mg by mouth 2 (two) times daily.    . diclofenac (VOLTAREN) 75 MG EC tablet Take 1 tablet (75 mg total) by mouth 2 (two) times daily. 30 tablet 0  . doxycycline (VIBRA-TABS) 100 MG tablet Take 1 tablet (100 mg total) by mouth 2 (two) times daily. 20 tablet 0   No current facility-administered medications on file prior to visit.     Objective:  Objective Physical Exam  Constitutional: He appears well-developed and well-nourished.  Musculoskeletal: He exhibits edema and tenderness.       Right knee: He exhibits decreased range of motion, swelling and effusion. Tenderness found. Medial joint line tenderness noted.  Skin: He is not diaphoretic.  Nursing note and vitals reviewed.  BP 104/68 mmHg  Pulse 75  Temp(Src) 98.1 F (36.7 C) (Oral)  Wt 206 lb (93.441 kg)  SpO2 98% Wt Readings from Last 3 Encounters:  11/20/14 206 lb (93.441 kg)  11/13/14 206 lb (93.441 kg)  12/10/13 199 lb 9.6 oz (90.538 kg)     Lab Results  Component Value Date   WBC 6.9 03/29/2012   HGB 14.2 03/29/2012   HCT 41.7 03/29/2012   PLT 277.0 03/29/2012  GLUCOSE 77 11/20/2014   CHOL 222* 12/04/2011   TRIG 145 12/04/2011   HDL 46 12/04/2011   LDLCALC 147* 12/04/2011   ALT 15 11/20/2014   AST 18 11/20/2014   NA 140 11/20/2014   K 4.4 11/20/2014   CL 101 11/20/2014   CREATININE 0.82 11/20/2014   BUN 15 11/20/2014   CO2 27 11/20/2014   TSH 0.45 03/29/2012   PSA 0.59 07/29/2013    Dg Cervical Spine Complete  11/20/2014  CLINICAL DATA:  Patient with grinding neck pain with rotation for 3 weeks. No known injury. Initial encounter. EXAM: CERVICAL SPINE - COMPLETE 4+ VIEW COMPARISON:  None. FINDINGS: Visualization through the  superior endplate of C7 on lateral view. Normal anatomic alignment of the cervical spine. Preservation of the vertebral body heights and intervertebral disc space heights. Prevertebral soft tissues are unremarkable. No evidence for acute cervical spine fracture. The lung apices are clear. Cervicothoracic junction is intact. Limited odontoid view. IMPRESSION: No acute osseous abnormality. No significant degenerative changes. Electronically Signed   By: Drew  Davis M.D.   On: 11/20/2014 17:41   Dg Knee Ap/lat W/sunrise Right  11/20/2014  CLINICAL DATA:  Patient with right knee pain, worse after running. EXAM: RIGHT KNEE 3 VIEWS COMPARISON:  None. FINDINGS: Normal anatomic alignment. No evidence for acute fracture or dislocation. Enthesopathic changes superior patella. No joint effusion. IMPRESSION: No acute osseous abnormality. Electronically Signed   By: Drew  Davis M.D.   On: 11/20/2014 17:43     Assessment & Plan:  Plan I have discontinued Mr. Dry's HYDROcodone-acetaminophen. I am also having him start on cephALEXin. Additionally, I am having him maintain his amphetamine-dextroamphetamine, diclofenac, and doxycycline.  Meds ordered this encounter  Medications  . cephALEXin (KEFLEX) 500 MG capsule    Sig: Take 1 capsule (500 mg total) by mouth 2 (two) times daily.    Dispense:  20 capsule    Refill:  0    Problem List Items Addressed This Visit    Cellulitis    Change abx to keflex Pt never got xrays done---enouraged pt to get xrays done       Other Visit Diagnoses    Cellulitis and abscess of leg    -  Primary    Relevant Medications    cephALEXin (KEFLEX) 500 MG capsule    Other Relevant Orders    Uric acid (Completed)    Comp Met (CMET) (Completed)       Follow-up: Return if symptoms worsen or fail to improve.  Yvonne Lowne, DO      

## 2014-11-21 NOTE — Assessment & Plan Note (Signed)
Change abx to keflex Pt never got xrays done---enouraged pt to get xrays done

## 2014-12-09 ENCOUNTER — Telehealth: Payer: Self-pay | Admitting: Family

## 2014-12-09 NOTE — Telephone Encounter (Signed)
Caller name: Self   Can be reached: [phone]   Reason for call: Patient requesting results from lab test and imaging done on 11/18

## 2014-12-11 NOTE — Telephone Encounter (Signed)
Labs and xrays appear normal. Lab results were mailed to pt on 11/20/14. Notified pt and he voices understanding.

## 2015-03-31 ENCOUNTER — Ambulatory Visit (INDEPENDENT_AMBULATORY_CARE_PROVIDER_SITE_OTHER): Payer: Managed Care, Other (non HMO) | Admitting: Family

## 2015-03-31 ENCOUNTER — Encounter: Payer: Self-pay | Admitting: Family

## 2015-03-31 VITALS — BP 124/88 | HR 68 | Temp 98.0°F | Resp 16 | Ht 75.5 in | Wt 215.4 lb

## 2015-03-31 DIAGNOSIS — M79671 Pain in right foot: Secondary | ICD-10-CM

## 2015-03-31 NOTE — Progress Notes (Signed)
Pre visit review using our clinic review tool, if applicable. No additional management support is needed unless otherwise documented below in the visit note. 

## 2015-03-31 NOTE — Progress Notes (Signed)
Subjective:    Patient ID: Victor Patterson, male    DOB: 17-Oct-1970, 45 y.o.   MRN: [id number]  HPI  Victor Patterson is a 45 yr old male who presents today with chief complaint of right foot pain. Pain began yesterday while playing baseball. Reports that he quickly transitioned form stand to sprint.  By the time he reached first base he was having pain. Reports pain is deep and aching. Used ice/ibuprofen which helped.  Pain is worse with walking.    Review of Systems    see HPI  Past Medical History  Diagnosis Date  . ADD (attention deficit disorder)     see psych  . Depression     see psych  . Hyperlipidemia     not on meds  . Deviated septum   . Eustachian tube dysfunction     right  . Proctalgia fugax   . Herniated disc     lower back -has seen ortho in the past and had steroid shots 2005  . Elevated blood-pressure reading without diagnosis of hypertension     resolved  . Liver hemangioma 06/2009  . Rib fracture 06/2009    8th and 9th  . 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 Marital Status: Married    Spouse Name: N/A  . Number of Children: N/A  . Years of Education: N/A   Occupational History  . Not on file.   Social History Main Topics  . Smoking status: Never Smoker   . Smokeless tobacco: Never Used  . Alcohol Use: 1.2 oz/week    2 Cans of beer per week     Comment: 3-4 week  . Drug Use: No  . Sexual Activity: Not on file   Other Topics Concern  . 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    Daily Caffeine Use 2/day      Illicit Drug Use - no   Alcohol Use - yes (social)   no exercise   diet-- candy a lot otherwise diet is average     Past Surgical History  Procedure Laterality Date  . Colonoscopy  2006 and 05/2010    hemorrhoids  . Rhinoplasty   05/2012    Family History  Problem Relation Age of Onset  . Diabetes Maternal Uncle   . Coronary artery disease      grandgfather-late in life  . Colon cancer      grandmother in her 39's  . Other      no family history of liver disease  . Anxiety disorder Father   . Colon cancer Maternal Grandfather   . Anxiety disorder Mother     No Known Allergies  No current outpatient prescriptions on file prior to visit.   No current facility-administered medications on file prior to visit.    BP 124/88 mmHg  Patterson 66  Temp(Src) 98 F (36.7 C) (Oral)  Resp 16  Ht 6' 3.5" (1.918 m)  Wt 215 lb 6.4 oz (97.705 kg)  BMI 26.56 kg/m2  SpO2 98%    Objective:   Physical Exam  Constitutional: He appears well-developed and well-nourished. No distress.  HENT:  Head: Normocephalic and atraumatic.  Eyes: No scleral icterus.  Musculoskeletal: He exhibits no edema.  +  tenderness to palpation right arch  Skin: Skin is warm and dry.  Psychiatric: He has a normal mood and affect. His behavior is normal. Thought content normal.          Assessment & Plan:  Arch pain in right foot- suspect right arch strain. Advised pt to use aleve for the next few days as well as to ice foot on a frozen water bottle twice daily. If symptoms worsen or do not improve in 1 week, consider referral to sports medicine.

## 2015-03-31 NOTE — Patient Instructions (Signed)
Roll right foot on frozen water bottle twice daily. You may take aleve 220mg  twice daily for next 3-5 days.  Please call if pain worsens or if pain does not continue to improve.

## 2015-04-02 ENCOUNTER — Telehealth: Payer: Self-pay | Admitting: Family

## 2015-04-02 DIAGNOSIS — M79671 Pain in right foot: Secondary | ICD-10-CM

## 2015-04-02 NOTE — Telephone Encounter (Signed)
Pt called in requesting a X-Ray order. He says that he doesn't want to go into details but PCP will be aware of why he need and x-ray.    Pt is requesting a call back for confirmation.   CB: [phone]

## 2015-04-02 NOTE — Telephone Encounter (Signed)
Melissa-- please advise? 

## 2015-04-04 NOTE — Telephone Encounter (Signed)
Placed order for right foot x ray.

## 2015-04-05 NOTE — Telephone Encounter (Signed)
Notified pt and he voices understanding. 

## 2015-04-07 ENCOUNTER — Telehealth: Payer: Self-pay | Admitting: *Deleted

## 2015-04-07 ENCOUNTER — Ambulatory Visit (HOSPITAL_BASED_OUTPATIENT_CLINIC_OR_DEPARTMENT_OTHER)
Admission: RE | Admit: 2015-04-07 | Discharge: 2015-04-07 | Disposition: A | Discharge status: Home / Self Care | Payer: Managed Care, Other (non HMO) | Source: Ambulatory Visit | Attending: Family | Admitting: Family

## 2015-04-07 DIAGNOSIS — M79671 Pain in right foot: Secondary | ICD-10-CM | POA: Diagnosis not present

## 2015-04-07 NOTE — Telephone Encounter (Signed)
Referral placed.

## 2015-04-07 NOTE — Telephone Encounter (Signed)
-----   Message from Debbrah Alar, NP sent at 04/07/2015  4:34 PM EDT ----- Foot x ray is negative for fracture.

## 2015-04-07 NOTE — Telephone Encounter (Signed)
Pt states foot is still hurting despite aleve and applying ice pack. Pt would like to proceed with referral to sports medicine as discussed at last office visit.

## 2015-04-13 ENCOUNTER — Encounter: Payer: Self-pay | Admitting: Family Medicine

## 2015-04-13 ENCOUNTER — Ambulatory Visit (INDEPENDENT_AMBULATORY_CARE_PROVIDER_SITE_OTHER): Payer: Managed Care, Other (non HMO) | Admitting: Family Medicine

## 2015-04-13 VITALS — BP 121/83 | HR 71 | Ht 76.0 in | Wt 210.0 lb

## 2015-04-13 DIAGNOSIS — M79671 Pain in right foot: Secondary | ICD-10-CM

## 2015-04-13 NOTE — Assessment & Plan Note (Signed)
consistent with midfoot sprain and mild strain of intrinsic muscle of foot.  Icing, arch binder, nsaids, supportive shoes.  Activities as tolerated.  Reassured.  F/u prn.  Call us with any questions or concerns.

## 2015-04-13 NOTE — Progress Notes (Signed)
PCP and consultation requested by: Nance Pear., NP  Subjective:   HPI: Patient is a 45 y.o. male here for right foot pain.  Patient reports on 3/28 he was playing softball. Came around second base and felt like his right foot became weak. Didn't notice pain until he was on third base - some dorsal midfoot soreness. Eventually became more sore including plantar pain.. No bruising, some slight swelling. Pain now 4/10, dull.  Some improvement past couple days. Worse using stairs, walking. Tried icing, elevating, ibuprofen. No prior issues. No skin changes, numbness.  Past Medical History  Diagnosis Date  . ADD (attention deficit disorder)     see psych  . Depression     see psych  . Hyperlipidemia     not on meds  . Deviated septum   . Eustachian tube dysfunction     right  . Proctalgia fugax   . Herniated disc     lower back -has seen ortho in the past and had steroid shots 2005  . Elevated blood-pressure reading without diagnosis of hypertension     resolved  . Liver hemangioma 06/2009  . Rib fracture 06/2009    8th and 9th  . 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   No current facility-administered medications on file prior to visit.    Past Surgical History  Procedure Laterality Date  . Colonoscopy  2006 and 05/2010    hemorrhoids  . Rhinoplasty  05/2012    No Known Allergies  Social History   Social History  . Marital Status: Married    Spouse Name: N/A  . Number of Children: N/A  . Years of Education: N/A   Occupational History  . Not on file.   Social History Main Topics  . Smoking status: Never Smoker   . Smokeless tobacco: Never Used  . Alcohol Use: 1.2 oz/week    2 Cans of beer per week     Comment: 3-4 week  . Drug Use: No  . Sexual Activity: Not on file   Other Topics Concern  . Not on file   Social History Narrative   "[name]"   Married, no children   (wife [name], nephew [name])   Occupation: [employer], road side assistance for  trucks    Patient has never smoked.    Daily Caffeine Use 2/day      Illicit Drug Use - no   Alcohol Use - yes (social)   no exercise   diet-- candy a lot otherwise diet is average     Family History  Problem Relation Age of Onset  . Diabetes Maternal Uncle   . Coronary artery disease      grandgfather-late in life  . Colon cancer      grandmother in her 62's  . Other      no family history of liver disease  . Anxiety disorder Father   . Colon cancer Maternal Grandfather   . Anxiety disorder Mother     BP 121/83 mmHg  Pulse 71  Ht 6\' 4"  (1.93 m)  Wt 210 lb (95.255 kg)  BMI 25.57 kg/m2  Review of Systems: See HPI above.    Objective:  Physical Exam:  Gen: NAD, comfortable in exam room  Right foot/ankle: No gross deformity, swelling, ecchymoses. FROM without pain. TTP mildly 2nd metatarsal, less at TMT joint with 2nd MT and mid-portion of plantar fascia area deep.  No  other tenderness. Negative ant drawer and talar tilt.   Negative syndesmotic compression. Negative metatarsal squeeze. Thompsons test negative. NV intact distally.  Left foot/ankle: FROM without pain.    MSK u/s right foot:  No cortical irregularity, edema, neovascularity of 1st-3rd metatarsals.  No muscle or tendon tears.  Assessment & Plan:  1. Right foot injury - consistent with midfoot sprain and mild strain of intrinsic muscle of foot.  Icing, arch binder, nsaids, supportive shoes.  Activities as tolerated.  Reassured.  F/u prn.  Call us with any questions or concerns.

## 2015-04-13 NOTE — Patient Instructions (Signed)
You have a Grade 2 foot sprain, Grade 1 strain of an intrinsic muscle of the foot. I would expect another 2 weeks for this to feel completely improved. Continue with icing 15 minutes at a time 3-4 times a day. Consider arch binders if these provide you with some relief. Ibuprofen 600mg  three times a day with food OR aleve 2 tabs twice a day with food for pain and inflammation as needed. Wear supportive shoes when up and walking around until this has resolved. Avoid flat shoes, barefoot walking as much as possible. Call me if you have any problems. Activities as tolerated - typically want pain level to be less than a 3 on a scale of 1-10 and not be limping before you return to running and softball. Follow up as needed.

## 2017-04-26 ENCOUNTER — Encounter: Payer: Self-pay | Admitting: Internal Medicine

## 2017-05-01 ENCOUNTER — Encounter: Payer: Self-pay | Admitting: Internal Medicine
